# Patient Record
Sex: Female | Born: 1980 | Race: Black or African American | Hispanic: No | Marital: Single | Smoking: Never smoker
Health system: Southern US, Community
[De-identification: ages and names within clinical notes are randomized; demographics above are authoritative.]

## PROBLEM LIST (undated history)

## (undated) HISTORY — PX: TONSILLECTOMY: SUR1361

## (undated) HISTORY — PX: TUBAL LIGATION: SHX77

---

## 2004-06-03 ENCOUNTER — Observation Stay: Payer: Self-pay

## 2004-10-05 ENCOUNTER — Observation Stay: Payer: Self-pay | Admitting: Unknown Physician Specialty

## 2004-10-12 ENCOUNTER — Inpatient Hospital Stay: Payer: Self-pay | Admitting: Obstetrics & Gynecology

## 2004-10-16 ENCOUNTER — Emergency Department: Payer: Self-pay | Admitting: Emergency Medicine

## 2004-10-18 ENCOUNTER — Emergency Department: Payer: Self-pay | Admitting: Emergency Medicine

## 2006-07-19 IMAGING — US US OB US >=[ID] SNGL FETUS
1 series · 17 of 28 positions shown · non-contrast
Comparison: none

REASON FOR EXAM: Decreased fluid
COMMENTS:

[Series 1: us ob us >=(id) sngl fetus · 17 of 53 slices shown]
[im 1/53]
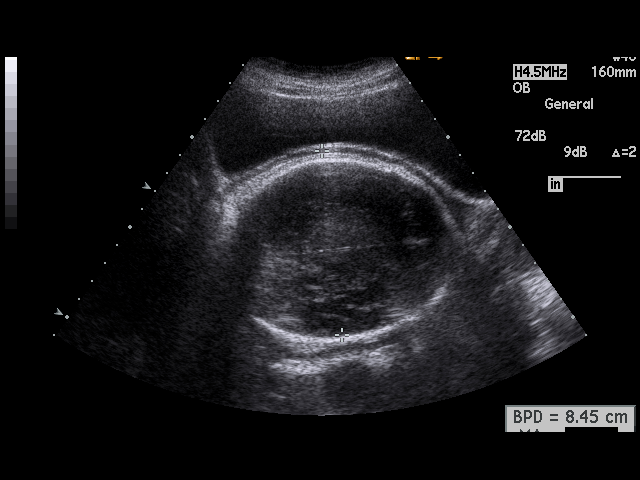
[im 4/53]
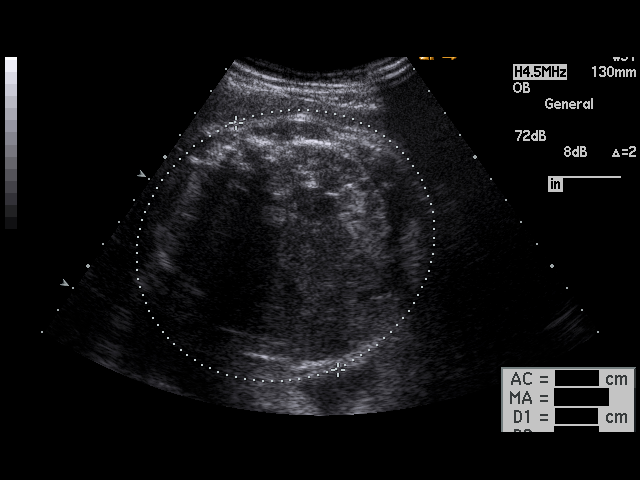
[im 8/53]
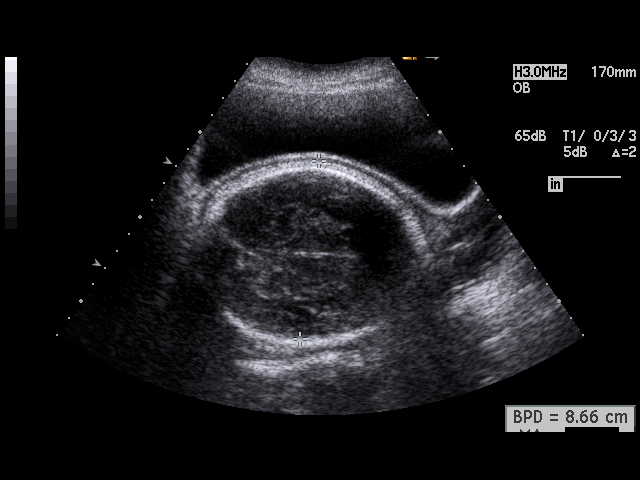
[im 10/53]
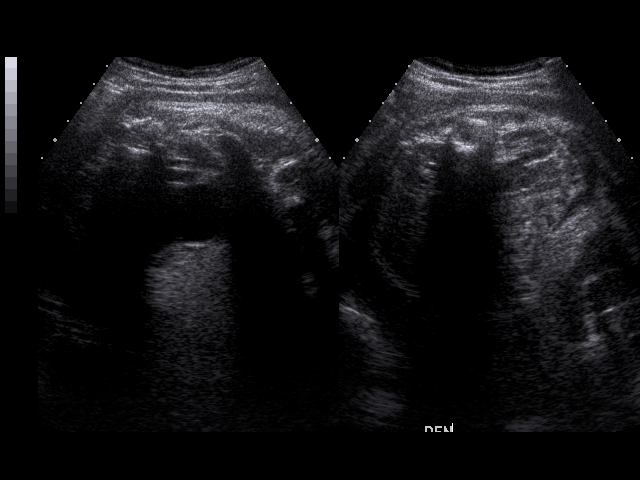
[im 14/53]
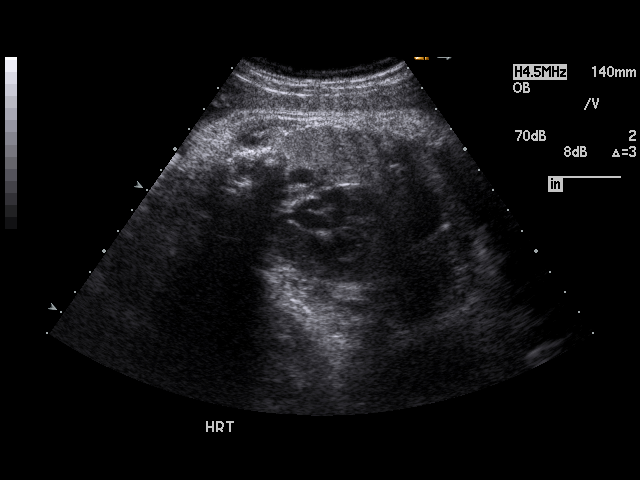
[im 18/53]
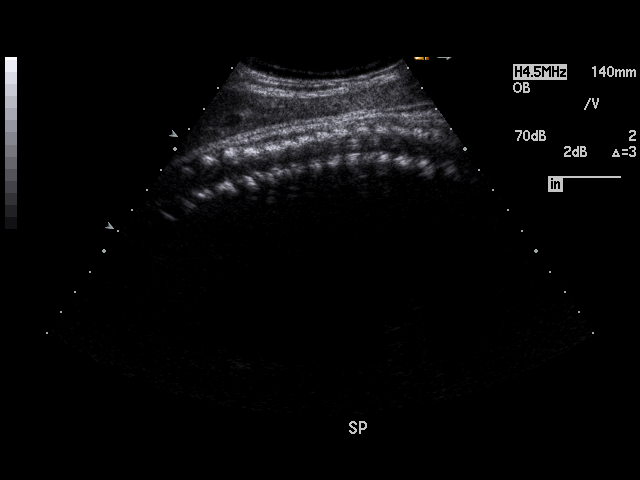
[im 20/53]
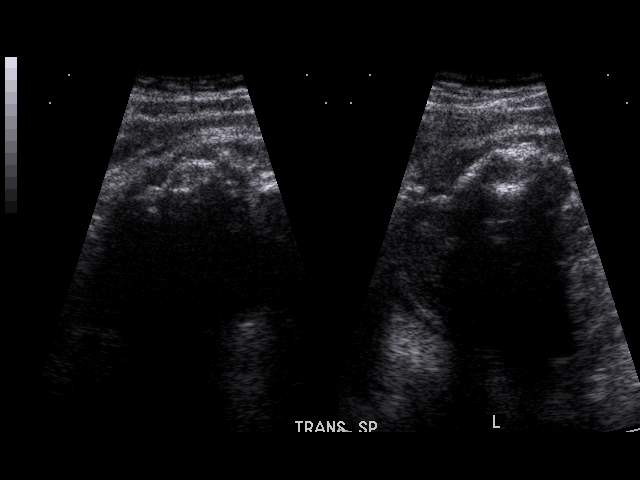
[im 24/53]
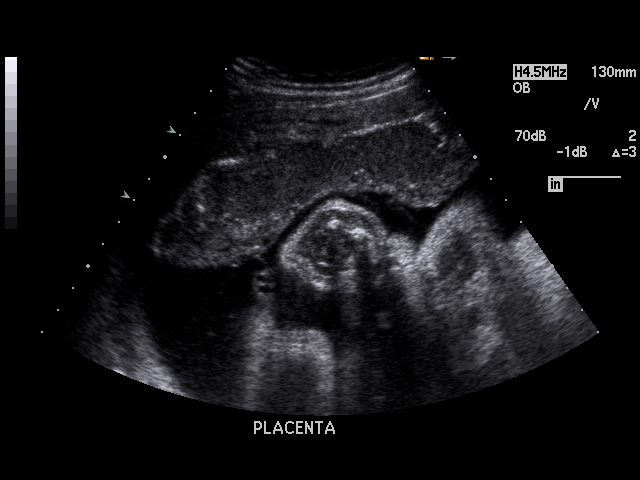
[im 27/53]
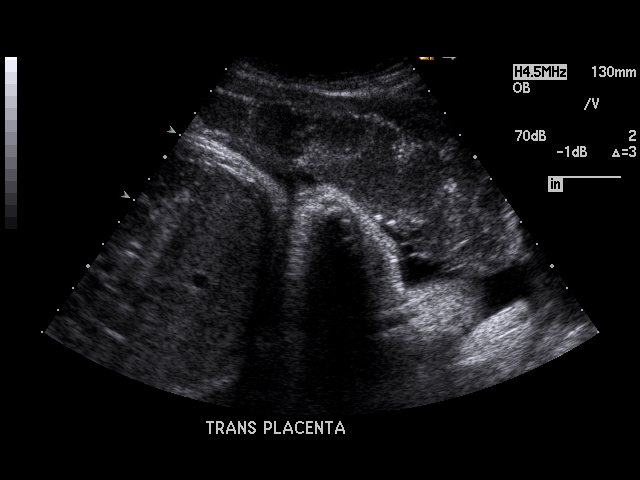
[im 29/53]
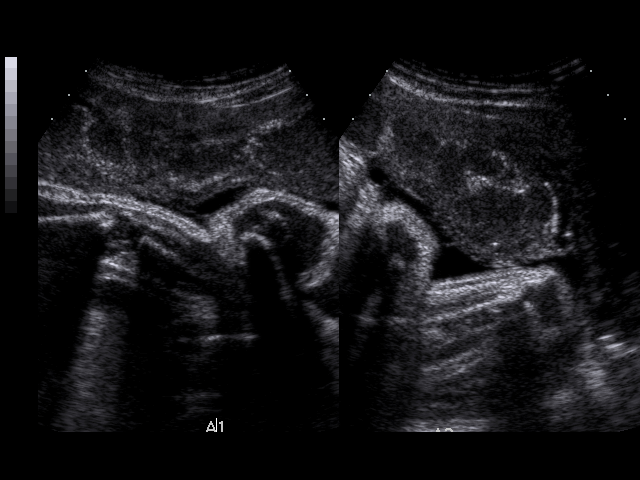
[im 33/53]
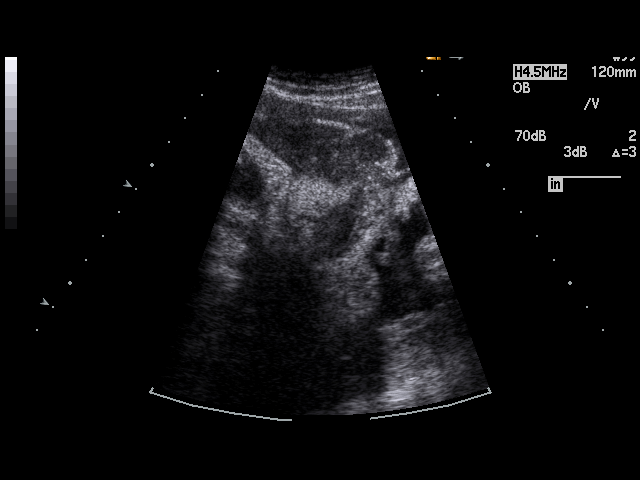
[im 35/53]
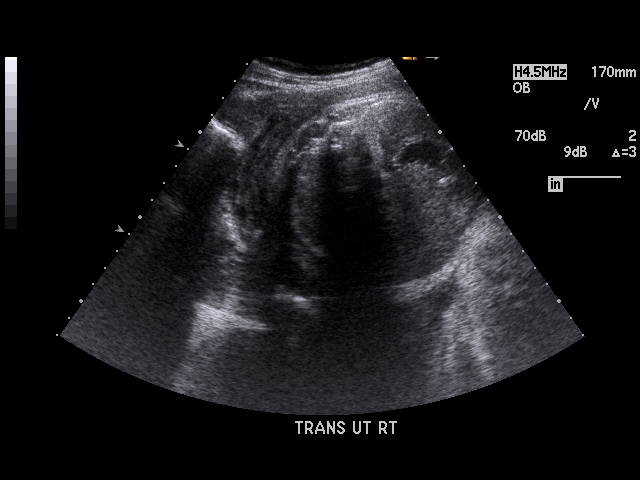
[im 39/53]
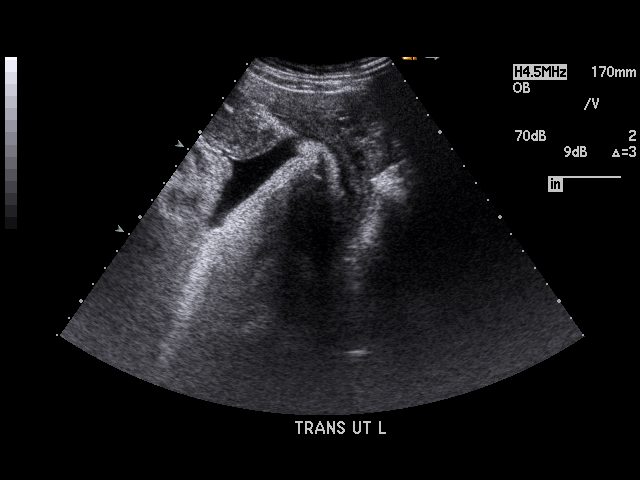
[im 43/53]
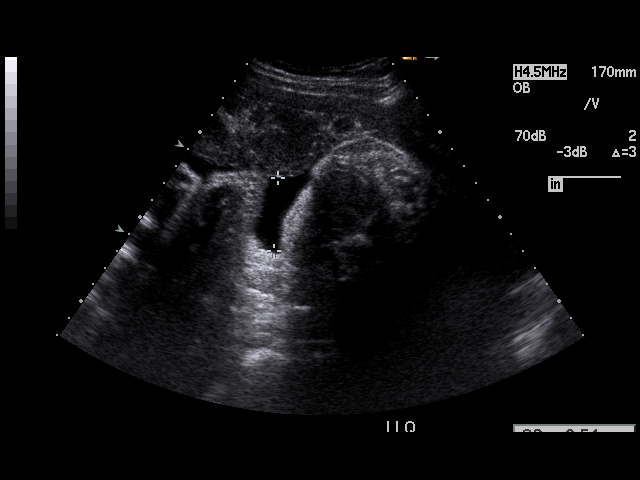
[im 45/53]
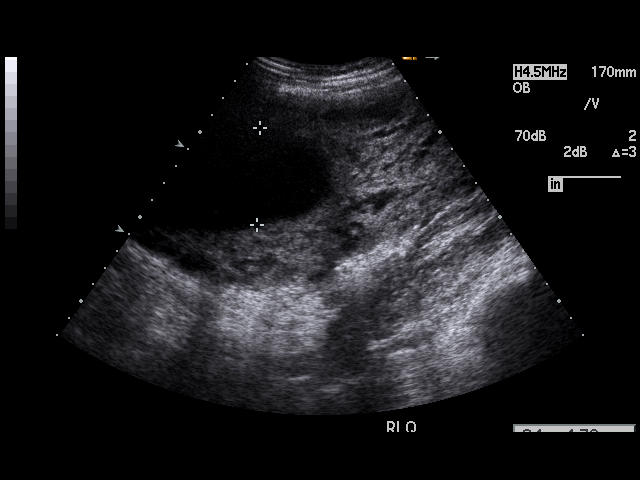
[im 49/53]
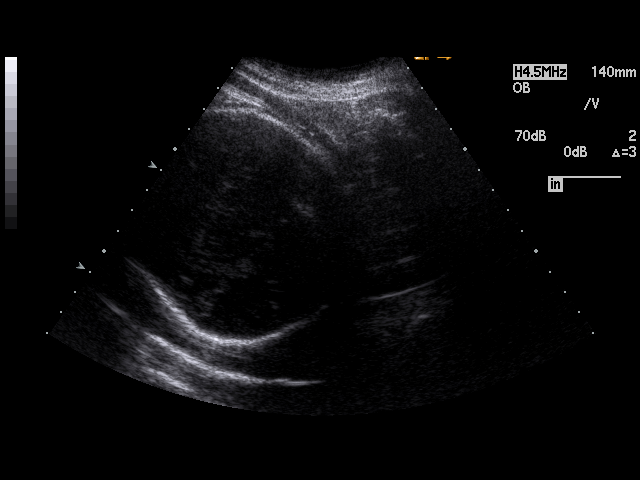
[im 53/53]
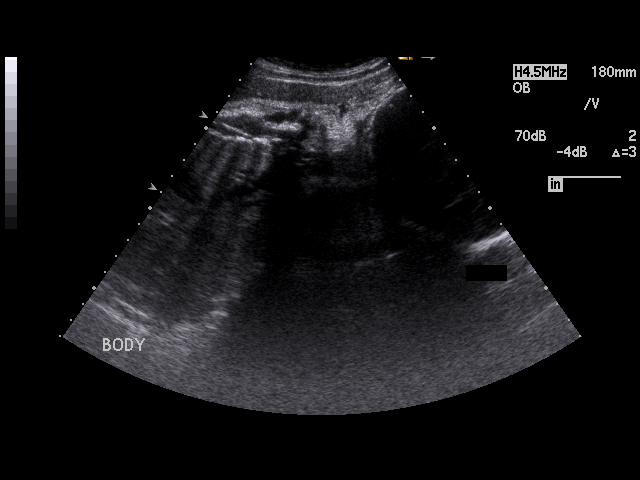

[17 of 28 positions shown; findings below may reference images not displayed]

PROCEDURE:     US  - US PREGNANCY / FETAL AGE  - October 05, 2004  [DATE]

RESULT:     Sonographic evaluation, using a routine OB protocol,
demonstrates a single intrauterine fetus with cardiac, trunk, extremity and
diaphragmatic motion seen during the study. The heart rate is 131 beats per
minute. Gestational age is calculated at 37 weeks, 0 days with an EDD of
10/26/2004. Amniotic fluid volume appears to be visually normal and measures
17.77 cm which is within the 50th to 95th percentile range. The placenta is
anterior and Grade III in appearance and extending to the fundus. There is
no evidence of marginal placenta or placenta previa.

Fetal anatomy appears to be normal.

There is a cephalic presentation. The fetal head appears to be engaged.
IMPRESSION: Gestational age of this single intrauterine fetus is 37
weeks, 0 days.

## 2011-02-11 ENCOUNTER — Emergency Department: Payer: Self-pay | Admitting: Emergency Medicine

## 2011-02-28 ENCOUNTER — Emergency Department: Payer: Self-pay | Admitting: Emergency Medicine

## 2011-04-26 ENCOUNTER — Inpatient Hospital Stay (HOSPITAL_COMMUNITY)
Admission: AD | Admit: 2011-04-26 | Discharge: 2011-04-27 | Disposition: A | Discharge status: Home / Self Care | Payer: BC Managed Care – PPO | Source: Ambulatory Visit | Attending: Family Medicine | Admitting: Family Medicine

## 2011-04-26 ENCOUNTER — Encounter (HOSPITAL_COMMUNITY): Payer: Self-pay | Admitting: *Deleted

## 2011-04-26 DIAGNOSIS — O210 Mild hyperemesis gravidarum: Secondary | ICD-10-CM | POA: Insufficient documentation

## 2011-04-26 DIAGNOSIS — O219 Vomiting of pregnancy, unspecified: Secondary | ICD-10-CM

## 2011-04-26 LAB — URINALYSIS, ROUTINE W REFLEX MICROSCOPIC
Bilirubin Urine: NEGATIVE
Glucose, UA: NEGATIVE mg/dL
Hgb urine dipstick: NEGATIVE
Protein, ur: NEGATIVE mg/dL

## 2011-04-26 LAB — POCT PREGNANCY, URINE: Preg Test, Ur: POSITIVE

## 2011-04-26 NOTE — Progress Notes (Signed)
PT SAYS SHE HAD OB APPOINTMENT TODAY- EVERYTHING OK-  THEN HOME - FIXED DINNER- THEN VOMITED AND STRINGS OF BLOOD CLOTS.   SAYS  VOMITED  ON WAY TO HOSPITAL.

## 2011-04-26 NOTE — Progress Notes (Signed)
PT DENIES NAUSEA- JUST THROAT HURTS.

## 2011-04-27 MED ORDER — GI COCKTAIL ~~LOC~~
30.0000 mL | Freq: Once | ORAL | Status: AC
  Administered 2011-04-27: 30 mL via ORAL
  Filled 2011-04-27: qty 30

## 2011-04-27 NOTE — ED Provider Notes (Signed)
Chart reviewed and agree with management and plan.  

## 2011-04-27 NOTE — ED Provider Notes (Signed)
History     No chief complaint on file.  HPI Misty Archer 30 y.o. 17w 3d  Gets prenatal care in [place].  Lives in [place].  Has had periodic vomiting during this pregnancy.  Tonight had vomiting after eating and had red blood and small clots in the vomitus.  Was concerned so came to [facility].  Has zofran at home to use for nausea.  Has not taken any today.  C/O sore throat noted after vomiting tonight.   OB History    Grav Para Term Preterm Abortions TAB SAB Ect Mult Living   2 1 1       1       Past Medical History  Diagnosis Date  . Asthma     Past Surgical History  Procedure Date  . Tonsillectomy     Family History  Problem Relation Age of Onset  . Diabetes Mother   . Hypertension Mother   . Diabetes Father   . Hypertension Father     History  Substance Use Topics  . Smoking status: Never Smoker   . Smokeless tobacco: Not on file  . Alcohol Use: No    Allergies:  Allergies  Allergen Reactions  . Codeine Rash  . Latex Rash    Prescriptions prior to admission  Medication Sig Dispense Refill  . acetaminophen (TYLENOL) 500 MG tablet Take 500 mg by mouth every 6 (six) hours as needed. For pain or headache       . flintstones complete (FLINTSTONES) 60 MG chewable tablet Chew 1 tablet by mouth daily.        . ondansetron (ZOFRAN) 4 MG tablet Take 4 mg by mouth every 8 (eight) hours as needed. For nausea         Review of Systems  HENT: Positive for sore throat.   Gastrointestinal: Positive for nausea and vomiting. Negative for constipation.   Physical Exam   Blood pressure 101/60, pulse 81, temperature 99 F (37.2 C), temperature source Oral, resp. rate 18, height 5\' 6"  (1.676 m), weight 133 lb 6 oz (60.499 kg).  Physical Exam  Nursing note and vitals reviewed. Constitutional: She is oriented to person, place, and time. She appears well-developed and well-nourished.  HENT:  Head: Normocephalic.       Throat - very mild diffuse pink color, no swelling  noted, no blood seen  Eyes: EOM are normal.       Conjunctiva red especially in right eye  Neck: Neck supple.  GI: Soft. There is no tenderness.  Musculoskeletal: Normal range of motion.  Neurological: She is alert and oriented to person, place, and time.  Skin: Skin is warm and dry.  Psychiatric: She has a normal mood and affect.    MAU Course  Procedures Results for orders placed during the hospital encounter of 04/26/11 (from the past 24 hour(s))  URINALYSIS, ROUTINE W REFLEX MICROSCOPIC     Status: Abnormal   Collection Time   04/26/11 10:05 PM      Component Value Range   Color, Urine YELLOW  YELLOW    Appearance CLEAR  CLEAR    Specific Gravity, Urine >1.030 (*) 1.005 - 1.030    pH 6.0  5.0 - 8.0    Glucose, UA NEGATIVE  NEGATIVE (mg/dL)   Hgb urine dipstick NEGATIVE  NEGATIVE    Bilirubin Urine NEGATIVE  NEGATIVE    Ketones, ur 15 (*) NEGATIVE (mg/dL)   Protein, ur NEGATIVE  NEGATIVE (mg/dL)   Urobilinogen, UA 2.0 (*) 0.0 -  1.0 (mg/dL)   Nitrite NEGATIVE  NEGATIVE    Leukocytes, UA NEGATIVE  NEGATIVE   POCT PREGNANCY, URINE     Status: Normal   Collection Time   04/26/11 10:19 PM      Component Value Range   Preg Test, Ur POSITIVE     MDM Client having sore throat from vomiting.  Will give GI cocktail.  Assessment and Plan  Vomiting in pregnancy - blood in vomitus  Plan Can use throat lozenges or warm tea for sore throat due to vomiting. Continue to use Zofran for vomiting. Drink at least 8 8-oz glasses of water every day. Call your doctor if your symptoms worsen.  Sherlyne Crownover 04/27/2011, 12:39 AM   Nolene Bernheim, NP 04/27/11 0136

## 2011-04-27 NOTE — Progress Notes (Signed)
T. Burleson, NP at bedside.  Assessment done and poc discussed with pt.  

## 2011-09-27 ENCOUNTER — Observation Stay: Payer: Self-pay | Admitting: Obstetrics and Gynecology

## 2011-09-30 ENCOUNTER — Inpatient Hospital Stay: Payer: Self-pay | Admitting: Obstetrics and Gynecology

## 2011-10-01 LAB — CBC WITH DIFFERENTIAL/PLATELET
Basophil #: 0 10*3/uL (ref 0.0–0.1)
Basophil %: 0.2 %
Eosinophil #: 0 10*3/uL (ref 0.0–0.7)
HCT: 32.7 % — ABNORMAL LOW (ref 35.0–47.0)
Lymphocyte %: 16.3 %
MCH: 32 pg (ref 26.0–34.0)
MCV: 99 fL (ref 80–100)
Neutrophil #: 4.7 10*3/uL (ref 1.4–6.5)
RDW: 13 % (ref 11.5–14.5)

## 2011-10-04 LAB — PATHOLOGY REPORT

## 2014-04-14 ENCOUNTER — Encounter (HOSPITAL_COMMUNITY): Payer: Self-pay | Admitting: *Deleted

## 2014-10-05 NOTE — Op Note (Signed)
PATIENT NAME:  Misty Archer, Misty Archer MR#:  [id number] DATE OF BIRTH:  08/02/1980  DATE OF PROCEDURE:  10/02/2011  PREOPERATIVE DIAGNOSIS: Desire for permanent sterility.   POSTOPERATIVE DIAGNOSIS: Desire for permanent sterility.   PROCEDURE: Bilateral tubal ligation.   SURGEON: Dierdre Searles. Paul Geraldyne Barraclough, MD   ANESTHESIA: General.   ESTIMATED BLOOD LOSS: Minimal.   COMPLICATIONS: None.   FINDINGS: Normal tubes and uterus.   DISPOSITION: To recovery room stable.   TECHNIQUE: The patient is prepped and draped in the usual sterile fashion after adequate anesthesia is obtained in the supine position on the operating room table. Marcaine 0.25% is used to infiltrate the skin just inferior to the umbilicus followed by skin incision using a scalpel. The rectus fascia is identified and tented anteriorly and incised using Metzenbaum scissors. Peritoneum is also penetrated and retractors are placed. The patient is then placed in Trendelenburg positioning. The left and right fallopian tubes are then grasped in the midportion and followed out to the fimbria. A loop is then tied with two Vicryl sutures, excised, and cauterized on each side. The specimen is sent to pathology for further review. Excellent hemostasis is noted. The rectus fascia is closed with a 0 Vicryl suture, the subcuticular layer is closed with a 4-0 Vicryl suture, and the skin with Dermabond. The patient goes to recovery room in stable condition. All sponge, instrument, and needle counts are correct.    ____________________________ R. Annamarie MajorPaul Aniza Shor, MD rph:drc D: 10/02/2011 09:11:15 ET T: 10/02/2011 10:59:46 ET JOB#: [id number] Harrel LemonOBERT P Auburn Hert MD ELECTRONICALLY SIGNED 10/14/2011 10:25

## 2014-10-12 ENCOUNTER — Encounter (HOSPITAL_COMMUNITY): Payer: Self-pay | Admitting: Emergency Medicine

## 2014-10-12 DIAGNOSIS — H9209 Otalgia, unspecified ear: Secondary | ICD-10-CM | POA: Diagnosis not present

## 2014-10-12 DIAGNOSIS — R05 Cough: Secondary | ICD-10-CM | POA: Diagnosis present

## 2014-10-12 DIAGNOSIS — Z91040 Latex allergy status: Secondary | ICD-10-CM | POA: Diagnosis not present

## 2014-10-12 DIAGNOSIS — Z79899 Other long term (current) drug therapy: Secondary | ICD-10-CM | POA: Diagnosis not present

## 2014-10-12 DIAGNOSIS — J45909 Unspecified asthma, uncomplicated: Secondary | ICD-10-CM | POA: Diagnosis not present

## 2014-10-12 NOTE — ED Notes (Addendum)
Reports asthma flare-up 1 week ago followed by "small" asthma attacks over the past week.  Reports non-productive cough x 1 week.  Since Friday she is also having runny nose, low grade fever, and body aches.  States her daughter has been sick with a cold.

## 2014-10-13 ENCOUNTER — Emergency Department (HOSPITAL_COMMUNITY)
Admission: EM | Admit: 2014-10-13 | Discharge: 2014-10-13 | Disposition: A | Discharge status: Home / Self Care | Payer: BLUE CROSS/BLUE SHIELD | Attending: Emergency Medicine | Admitting: Emergency Medicine

## 2014-10-13 ENCOUNTER — Emergency Department (HOSPITAL_COMMUNITY): Payer: BLUE CROSS/BLUE SHIELD

## 2014-10-13 ENCOUNTER — Encounter (HOSPITAL_COMMUNITY): Payer: Self-pay | Admitting: Emergency Medicine

## 2014-10-13 DIAGNOSIS — J9801 Acute bronchospasm: Secondary | ICD-10-CM

## 2014-10-13 DIAGNOSIS — J3489 Other specified disorders of nose and nasal sinuses: Secondary | ICD-10-CM

## 2014-10-13 DIAGNOSIS — R0981 Nasal congestion: Secondary | ICD-10-CM

## 2014-10-13 MED ORDER — LORATADINE 10 MG PO TABS: 10.0000 mg | ORAL_TABLET | Freq: Every day | ORAL | Status: DC

## 2014-10-13 MED ORDER — PREDNISONE 20 MG PO TABS
60.0000 mg | ORAL_TABLET | Freq: Once | ORAL | Status: AC
  Administered 2014-10-13: 60 mg via ORAL
  Filled 2014-10-13: qty 3

## 2014-10-13 MED ORDER — LORATADINE 10 MG PO TABS
10.0000 mg | ORAL_TABLET | Freq: Once | ORAL | Status: AC
  Administered 2014-10-13: 10 mg via ORAL
  Filled 2014-10-13: qty 1

## 2014-10-13 MED ORDER — ALBUTEROL SULFATE (2.5 MG/3ML) 0.083% IN NEBU
5.0000 mg | INHALATION_SOLUTION | Freq: Once | RESPIRATORY_TRACT | Status: AC
  Administered 2014-10-13: 5 mg via RESPIRATORY_TRACT
  Filled 2014-10-13: qty 6

## 2014-10-13 MED ORDER — MOMETASONE FUROATE 50 MCG/ACT NA SUSP: 2.0000 | Freq: Every day | NASAL | Status: DC

## 2014-10-13 MED ORDER — OXYMETAZOLINE HCL 0.05 % NA SOLN
1.0000 | Freq: Two times a day (BID) | NASAL | Status: DC
Start: 2014-10-13 — End: 2015-08-17

## 2014-10-13 MED ORDER — PREDNISONE 20 MG PO TABS: ORAL_TABLET | ORAL | Status: DC

## 2014-10-13 NOTE — ED Provider Notes (Signed)
CSN: [id number]     Arrival date & time 10/12/14  2351 History  This chart was scribed for Loren Raceravid Noorah Giammona, MD by Abel PrestoKara Demonbreun, ED Scribe. This patient was seen in room A13C/A13C and the patient's care was started at 1:13 AM.     Chief Complaint  Patient presents with  . Cough  . Nasal Congestion     Patient is a 34 y.o. female presenting with cough. The history is provided by the patient. No language interpreter was used.  Cough Associated symptoms: chills, ear pain, fever, myalgias and rhinorrhea   Associated symptoms: no chest pain, no shortness of breath and no sore throat    HPI Comments: Misty Archer is a 34 y.o. female with PMHx of asthma who presents to the Emergency Department complaining of cough and nasal congestion with onset 1 week ago. 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 Pt states daughter has been sick recently with similar symptoms.   Pt also reports frequent "small" asthma attacks throughout the week.  She had been using her inhaler with mild relief. Pt notes associated low grade fever, chills, rhinorrhea, sneezing, sinus pressure, chest tightness, generalized body aches, ear pain, eye pain with onset 3 days ago.  Pt denies swelling or pain in bilateral LEs and chest pain.   Past Medical History  Diagnosis Date  . Asthma    Past Surgical History  Procedure Laterality Date  . Tonsillectomy     Family History  Problem Relation Age of Onset  . Diabetes Mother   . Hypertension Mother   . Diabetes Father   . Hypertension Father    History  Substance Use Topics  . Smoking status: Never Smoker   . Smokeless tobacco: Not on file  . Alcohol Use: No   OB History    Gravida Para Term Preterm AB TAB SAB Ectopic Multiple Living   2 1 1       1      Review of Systems  Constitutional: Positive for fever and chills.  HENT: Positive for congestion, ear pain, rhinorrhea, sinus pressure and sneezing. Negative for sore throat.   Eyes:  Positive for pain.  Respiratory: Positive for cough and chest tightness. Negative for shortness of breath.   Cardiovascular: Negative for chest pain and leg swelling.  Musculoskeletal: Positive for myalgias.     Allergies  Codeine and Latex  Home Medications   Prior to Admission medications   Medication Sig Start Date End Date Taking? Authorizing Provider  acetaminophen (TYLENOL) 500 MG tablet Take 500 mg by mouth every 6 (six) hours as needed. For pain or headache    Yes Historical Provider, MD  albuterol (PROVENTIL HFA;VENTOLIN HFA) 108 (90 BASE) MCG/ACT inhaler Inhale 2 puffs into the lungs every 6 (six) hours as needed for wheezing or shortness of breath.   Yes Historical Provider, MD  naproxen sodium (ANAPROX) 220 MG tablet Take 440 mg by mouth daily as needed (pain).   Yes Historical Provider, MD  OVER THE COUNTER MEDICATION Take 1 tablet by mouth daily as needed (cold / congestion). "Cold & Allergy"   Yes Historical Provider, MD  loratadine (CLARITIN) 10 MG tablet Take 1 tablet (10 mg total) by mouth daily. One po daily x 5 days 10/13/14   Loren Raceravid Ashly Yepez, MD  mometasone (NASONEX) 50 MCG/ACT nasal spray Place 2 sprays into the nose daily. 10/13/14   Loren Raceravid Amado Andal, MD  oxymetazoline (AFRIN NASAL SPRAY) 0.05 % nasal spray Place 1 spray  into both nostrils 2 (two) times daily. 10/13/14   Loren Racer, MD  predniSONE (DELTASONE) 20 MG tablet 3 tabs po day one, then 2 tabs daily x 4 days 10/13/14   Loren Racer, MD   BP 119/72 mmHg  Pulse 83  Temp(Src) 99.1 F (37.3 C) (Oral)  Resp 22  Wt 144 lb 6.4 oz (65.499 kg)  SpO2 97%  LMP 10/12/2014  Breastfeeding? Unknown Physical Exam  Constitutional: She is oriented to person, place, and time. She appears well-developed and well-nourished.  HENT:  Head: Normocephalic.  Eyes: Conjunctivae are normal.  Neck: Normal range of motion. Neck supple.  Pulmonary/Chest: Effort normal.  Musculoskeletal: Normal range of motion.  Neurological:  She is alert and oriented to person, place, and time.  Skin: Skin is warm and dry.  Psychiatric: She has a normal mood and affect. Her behavior is normal.  Nursing note and vitals reviewed.   ED Course  Procedures (including critical care time) DIAGNOSTIC STUDIES: Oxygen Saturation is 97% on room air, normal by my interpretation.    COORDINATION OF CARE: 1:20 AM Discussed treatment plan with patient at beside, the patient agrees with the plan and has no further questions at this time.   Labs Review Labs Reviewed - No data to display  Imaging Review Dg Chest 2 View  10/13/2014   CLINICAL DATA:  Cough and congestion with low-grade fever, 1 week duration.  EXAM: CHEST  2 VIEW  COMPARISON:  None.  FINDINGS: The heart size and mediastinal contours are within normal limits. Both lungs are clear. The visualized skeletal structures are unremarkable.  IMPRESSION: No active cardiopulmonary disease.   Electronically Signed   By: Ellery Plunk M.D.   On: 10/13/2014 00:17     EKG Interpretation None      MDM   Final diagnoses:  Bronchospasm  Nasal congestion  Sinus pressure   I personally performed the services described in this documentation, which was scribed in my presence. The recorded information has been reviewed and is accurate.  Feels better after breathing treatment. Return precautions given.    Loren Racer, MD 10/14/14 [phone]

## 2014-10-13 NOTE — ED Notes (Signed)
Pt. Left with all belongings and refused wheelchair 

## 2014-10-13 NOTE — Discharge Instructions (Signed)
Bronchospasm A bronchospasm is a spasm or tightening of the airways going into the lungs. During a bronchospasm breathing becomes more difficult because the airways get smaller. When this happens there can be coughing, a whistling sound when breathing (wheezing), and difficulty breathing. Bronchospasm is often associated with asthma, but not all patients who experience a bronchospasm have asthma. CAUSES  A bronchospasm is caused by inflammation or irritation of the airways. The inflammation or irritation may be triggered by:   Allergies (such as to animals, pollen, food, or mold). Allergens that cause bronchospasm may cause wheezing immediately after exposure or many hours later.   Infection. Viral infections are believed to be the most common cause of bronchospasm.   Exercise.   Irritants (such as pollution, cigarette smoke, strong odors, aerosol sprays, and paint fumes).   Weather changes. Winds increase molds and pollens in the air. Rain refreshes the air by washing irritants out. Cold air may cause inflammation.   Stress and emotional upset.  SIGNS AND SYMPTOMS   Wheezing.   Excessive nighttime coughing.   Frequent or severe coughing with a simple cold.   Chest tightness.   Shortness of breath.  DIAGNOSIS  Bronchospasm is usually diagnosed through a history and physical exam. Tests, such as chest X-rays, are sometimes done to look for other conditions. TREATMENT   Inhaled medicines can be given to open up your airways and help you breathe. The medicines can be given using either an inhaler or a nebulizer machine.  Corticosteroid medicines may be given for severe bronchospasm, usually when it is associated with asthma. HOME CARE INSTRUCTIONS   Always have a plan prepared for seeking medical care. Know when to call your health care provider and local emergency services (911 in the [place] Know where you can access local emergency care.  Only take medicines as  directed by your health care provider.  If you were prescribed an inhaler or nebulizer machine, ask your health care provider to explain how to use it correctly. Always use a spacer with your inhaler if you were given one.  It is necessary to remain calm during an attack. Try to relax and breathe more slowly.  Control your home environment in the following ways:   Change your heating and air conditioning filter at least once a month.   Limit your use of fireplaces and wood stoves.  Do not smoke and do not allow smoking in your home.   Avoid exposure to perfumes and fragrances.   Get rid of pests (such as roaches and mice) and their droppings.   Throw away plants if you see mold on them.   Keep your house clean and dust free.   Replace carpet with wood, tile, or vinyl flooring. Carpet can trap dander and dust.   Use allergy-proof pillows, mattress covers, and box spring covers.   Wash bed sheets and blankets every week in hot water and dry them in a dryer.   Use blankets that are made of polyester or cotton.   Wash hands frequently. SEEK MEDICAL CARE IF:   You have muscle aches.   You have chest pain.   The sputum changes from clear or white to yellow, green, gray, or bloody.   The sputum you cough up gets thicker.   There are problems that may be related to the medicine you are given, such as a rash, itching, swelling, or trouble breathing.  SEEK IMMEDIATE MEDICAL CARE IF:   You have worsening wheezing and coughing even  after taking your prescribed medicines.   You have increased difficulty breathing.   You develop severe chest pain. MAKE SURE YOU:   Understand these instructions.  Will watch your condition.  Will get help right away if you are not doing well or get worse. Document Released: 06/02/2003 Document Revised: 06/04/2013 Document Reviewed: 11/19/2012 [employer] Patient Information 2015 [employer]. This information is not  intended to replace advice given to you by your health care provider. Make sure you discuss any questions you have with your health care provider.  Sinusitis Sinusitis is redness, soreness, and inflammation of the paranasal sinuses. Paranasal sinuses are air pockets within the bones of your face (beneath the eyes, the middle of the forehead, or above the eyes). In healthy paranasal sinuses, mucus is able to drain out, and air is able to circulate through them by way of your nose. However, when your paranasal sinuses are inflamed, mucus and air can become trapped. This can allow bacteria and other germs to grow and cause infection. Sinusitis can develop quickly and last only a short time (acute) or continue over a long period (chronic). Sinusitis that lasts for more than 12 weeks is considered chronic.  CAUSES  Causes of sinusitis include:  Allergies.  Structural abnormalities, such as displacement of the cartilage that separates your nostrils (deviated septum), which can decrease the air flow through your nose and sinuses and affect sinus drainage.  Functional abnormalities, such as when the small hairs (cilia) that line your sinuses and help remove mucus do not work properly or are not present. SIGNS AND SYMPTOMS  Symptoms of acute and chronic sinusitis are the same. The primary symptoms are pain and pressure around the affected sinuses. Other symptoms include:  Upper toothache.  Earache.  Headache.  Bad breath.  Decreased sense of smell and taste.  A cough, which worsens when you are lying flat.  Fatigue.  Fever.  Thick drainage from your nose, which often is green and may contain pus (purulent).  Swelling and warmth over the affected sinuses. DIAGNOSIS  Your health care provider will perform a physical exam. During the exam, your health care provider may:  Look in your nose for signs of abnormal growths in your nostrils (nasal polyps).  Tap over the affected sinus to check  for signs of infection.  View the inside of your sinuses (endoscopy) using an imaging device that has a light attached (endoscope). If your health care provider suspects that you have chronic sinusitis, one or more of the following tests may be recommended:  Allergy tests.  Nasal culture. A sample of mucus is taken from your nose, sent to a lab, and screened for bacteria.  Nasal cytology. A sample of mucus is taken from your nose and examined by your health care provider to determine if your sinusitis is related to an allergy. TREATMENT  Most cases of acute sinusitis are related to a viral infection and will resolve on their own within 10 days. Sometimes medicines are prescribed to help relieve symptoms (pain medicine, decongestants, nasal steroid sprays, or saline sprays).  However, for sinusitis related to a bacterial infection, your health care provider will prescribe antibiotic medicines. These are medicines that will help kill the bacteria causing the infection.  Rarely, sinusitis is caused by a fungal infection. In theses cases, your health care provider will prescribe antifungal medicine. For some cases of chronic sinusitis, surgery is needed. Generally, these are cases in which sinusitis recurs more than 3 times per year, despite   other treatments. HOME CARE INSTRUCTIONS   Drink plenty of water. Water helps thin the mucus so your sinuses can drain more easily.  Use a humidifier.  Inhale steam 3 to 4 times a day (for example, sit in the bathroom with the shower running).  Apply a warm, moist washcloth to your face 3 to 4 times a day, or as directed by your health care provider.  Use saline nasal sprays to help moisten and clean your sinuses.  Take medicines only as directed by your health care provider.  If you were prescribed either an antibiotic or antifungal medicine, finish it all even if you start to feel better. SEEK IMMEDIATE MEDICAL CARE IF:  You have increasing pain or  severe headaches.  You have nausea, vomiting, or drowsiness.  You have swelling around your face.  You have vision problems.  You have a stiff neck.  You have difficulty breathing. MAKE SURE YOU:   Understand these instructions.  Will watch your condition.  Will get help right away if you are not doing well or get worse. Document Released: 05/30/2005 Document Revised: 10/14/2013 Document Reviewed: 06/14/2011 [employer] Patient Information 2015 [employer], [place]. This information is not intended to replace advice given to you by your health care provider. Make sure you discuss any questions you have with your health care provider.

## 2014-10-21 NOTE — H&P (Signed)
L&D Evaluation:  History Expanded:   HPI 34 yo G2P1001 whose EDC = 4/21.  [facility].  Pt presents with contractions.    Gravida 2    Term 1    Living 1    Blood Type O positive    Group B Strep Results (Result >5wks must be treated as unknown) negative    Maternal HIV Negative    Maternal Syphilis Ab Nonreactive    Maternal Varicella Immune    Rubella Results immune    Presents with contractions    Patient's Medical History No Chronic Illness    Patient's Surgical History none    Medications Pre Natal Vitamins    Allergies Codeine   Exam:   General no apparent distress    Mental Status clear    Chest clear    Heart normal sinus rhythm    Abdomen gravid, non-tender    Estimated Fetal Weight Average for gestational age    Pelvic 3-4    Mebranes Intact    FHT normal rate with no decels    Ucx irregular   Impression:   Impression early labor   Plan:   Comments Observation   Electronic Signatures: Towana Badgerosenow, Philip J (MD)  (Signed 20-Apr-13 01:36)  Authored: L&D Evaluation   Last Updated: 20-Apr-13 01:36 by Towana Badgerosenow, Philip J (MD)

## 2014-10-21 NOTE — H&P (Signed)
L&D Evaluation:  History:   HPI 34 yo G2P1001 [facility] 2373w2d [facility] presenting with sharp cramping pain starting this evening.  Was checked in clinic yesterday and had her membranes stripped.  Was noted to be 3/70/-1 at that check.  +FM, no LOF, no VB.  [facility].  PNL O+ / ABSC neg / RPR NR / HIV neg / HBsAg neg / RI / VI / pap wnl / 1-hr glucola 89 / GBS negative 09/12/2011    Patient's Medical History Asthma    Patient's Surgical History Tonsilectomy    Medications Pre Natal Vitamins    Allergies Codeine, Latex    Social History none    Family History Non-Contributory   ROS:   ROS All systems were reviewed.  HEENT, CNS, GI, GU, Respiratory, CV, Renal and Musculoskeletal systems were found to be normal.   Exam:   Vital Signs stable    General no apparent distress    Abdomen gravid, non-tender    Estimated Fetal Weight Average for gestational age    Fetal Position vtx    Edema no edema    Pelvic no external lesions, 3/70/-1 unchanged from clinic yesterday    Mebranes Intact    Ucx irregular   Impression:   Impression Braxton Hicks contractions vs round ligament pain   Plan:   Comments - The patient only showed some irregular contractions on monitoring, given her advanced cervical dilation she was recheked 2-hrs after presentation with no change.  A lot of her symptoms are positional and sound typical of round ligament pain.  The patient has scheduled follow up in clinic 10/03/2011   Electronic Signatures: Lorrene ReidStaebler, Ellean Firman M (MD)  (Signed 16-Apr-13 21:46)  Authored: L&D Evaluation   Last Updated: 16-Apr-13 21:46 by Lorrene ReidStaebler, Adal Sereno M (MD)

## 2015-04-02 ENCOUNTER — Ambulatory Visit (INDEPENDENT_AMBULATORY_CARE_PROVIDER_SITE_OTHER): Payer: BC Managed Care – PPO | Admitting: Family Medicine

## 2015-04-02 ENCOUNTER — Encounter: Payer: Self-pay | Admitting: Family Medicine

## 2015-04-02 VITALS — BP 118/68 | HR 72 | Ht 67.0 in | Wt 145.0 lb

## 2015-04-02 DIAGNOSIS — G44219 Episodic tension-type headache, not intractable: Secondary | ICD-10-CM | POA: Diagnosis not present

## 2015-04-02 DIAGNOSIS — R55 Syncope and collapse: Secondary | ICD-10-CM

## 2015-04-02 NOTE — Progress Notes (Addendum)
Name: Misty Archer   MRN: [id number]    DOB: 08/04/1953   Date:04/02/2015       Progress Note  Subjective  Chief Complaint  Chief Complaint  Patient presents with  . Loss of Consciousness    had a "migraine headache today" was standing in sun and started feeling worse. Went inside school and "slid down the wall after everything went dark". Only "out a few seconds"    Loss of Consciousness This is a new problem. The current episode started today. There was no loss of consciousness. The symptoms are aggravated by standing. Associated symptoms include headaches and nausea. Pertinent negatives include no abdominal pain, back pain, bladder incontinence, bowel incontinence, chest pain, dizziness, fever, focal weakness, malaise/fatigue, palpitations, vertigo or weakness. She has tried position change for the symptoms. Her past medical history is significant for vertigo. There is no history of arrhythmia, CAD, a clotting disorder, CVA, DM, HTN, seizures, a sudden death in family or TIA.  Headache  This is a new problem. The current episode started 1 to 4 weeks ago. The problem occurs daily. The problem has been waxing and waning. The pain is located in the frontal region. The pain quality is not similar to prior headaches. The quality of the pain is described as band-like. The pain is at a severity of 3/10. The pain is moderate. Associated symptoms include nausea, phonophobia and photophobia. Pertinent negatives include no abdominal pain, back pain, blurred vision, coughing, dizziness, ear pain, eye watering, fever, insomnia, neck pain, numbness, sore throat, tingling, weakness or weight loss. The symptoms are aggravated by noise. She has tried acetaminophen for the symptoms. The treatment provided mild relief. There is no history of hypertension or recent head traumas.    No problem-specific assessment & plan notes found for this encounter.   No past medical history on file.  Past Surgical History   Procedure Laterality Date  . Tubal ligation      No family history on file.  Social History   Social History  . Marital Status: Unknown    Spouse Name: N/A  . Number of Children: N/A  . Years of Education: N/A   Occupational History  . Not on file.   Social History Main Topics  . Smoking status: Never Smoker   . Smokeless tobacco: Not on file  . Alcohol Use: No  . Drug Use: No  . Sexual Activity: Not on file   Other Topics Concern  . Not on file   Social History Narrative  . No narrative on file    No Known Allergies   Review of Systems  Constitutional: Negative for fever, chills, weight loss and malaise/fatigue.  HENT: Negative for ear discharge, ear pain and sore throat.   Eyes: Positive for photophobia. Negative for blurred vision.  Respiratory: Negative for cough, sputum production, shortness of breath and wheezing.   Cardiovascular: Positive for syncope. Negative for chest pain, palpitations and leg swelling.  Gastrointestinal: Positive for nausea. Negative for heartburn, abdominal pain, diarrhea, constipation, blood in stool, melena and bowel incontinence.  Genitourinary: Negative for bladder incontinence, dysuria, urgency, frequency and hematuria.  Musculoskeletal: Negative for myalgias, back pain, joint pain and neck pain.  Skin: Negative for rash.  Neurological: Positive for headaches. Negative for dizziness, vertigo, tingling, sensory change, focal weakness, weakness and numbness.  Endo/Heme/Allergies: Negative for environmental allergies and polydipsia. Does not bruise/bleed easily.  Psychiatric/Behavioral: Negative for depression and suicidal ideas. The patient is not nervous/anxious and does not have insomnia.  Objective  Filed Vitals:   04/02/15 1524  BP: 118/68  Pulse: 72  Height:  (1.702 m)  Weight: 145 lb (65.772 kg)    Physical Exam  Constitutional: She is oriented to person, place, and time and well-developed, well-nourished,  and in no distress. No distress.  HENT:  Head: Normocephalic and atraumatic.  Right Ear: External ear normal.  Left Ear: External ear normal.  Nose: Nose normal.  Mouth/Throat: Oropharynx is clear and moist.  Eyes: Conjunctivae and EOM are normal. Pupils are equal, round, and reactive to light. Right eye exhibits no discharge. Left eye exhibits no discharge.  Neck: Normal range of motion. Neck supple. No JVD present. No thyromegaly present.  Cardiovascular: Normal rate, regular rhythm, normal heart sounds and intact distal pulses.  Exam reveals no gallop and no friction rub.   No murmur heard. Pulmonary/Chest: Effort normal and breath sounds normal.  Abdominal: Soft. Bowel sounds are normal. She exhibits no mass. There is no tenderness. There is no guarding.  Musculoskeletal: Normal range of motion. She exhibits no edema.  Lymphadenopathy:    She has no cervical adenopathy.  Neurological: She is alert and oriented to person, place, and time. She has normal motor skills, normal sensation, normal strength, normal reflexes and intact cranial nerves.  Skin: Skin is warm and dry. She is not diaphoretic.  Psychiatric: Mood and affect normal.  Nursing note and vitals reviewed.     Assessment & Plan  Problem List Items Addressed This Visit    None    Visit Diagnoses    Episodic tension-type headache, not intractable    -  Primary    Relevant Orders    CT Head W Contrast    CT Head Wo Contrast    Vasovagal near syncope             Dr. Elizabeth Sauer [facility]  04/02/2015   CT was denied by insurance- called pt to recheck her in office in 1 week- was notified of CT and if headache gets worse or has another syncopal episode... is to go directly to ER

## 2015-04-03 ENCOUNTER — Encounter (HOSPITAL_COMMUNITY): Payer: Self-pay | Admitting: Emergency Medicine

## 2015-04-05 ENCOUNTER — Emergency Department (HOSPITAL_COMMUNITY): Payer: BLUE CROSS/BLUE SHIELD

## 2015-04-05 ENCOUNTER — Encounter (HOSPITAL_COMMUNITY): Payer: Self-pay | Admitting: *Deleted

## 2015-04-05 ENCOUNTER — Emergency Department (HOSPITAL_COMMUNITY)
Admission: EM | Admit: 2015-04-05 | Discharge: 2015-04-06 | Disposition: A | Discharge status: Home / Self Care | Payer: BLUE CROSS/BLUE SHIELD | Attending: Emergency Medicine | Admitting: Emergency Medicine

## 2015-04-05 ENCOUNTER — Other Ambulatory Visit: Payer: Self-pay

## 2015-04-05 DIAGNOSIS — R079 Chest pain, unspecified: Secondary | ICD-10-CM | POA: Diagnosis present

## 2015-04-05 DIAGNOSIS — Z91040 Latex allergy status: Secondary | ICD-10-CM | POA: Insufficient documentation

## 2015-04-05 DIAGNOSIS — G43809 Other migraine, not intractable, without status migrainosus: Secondary | ICD-10-CM | POA: Diagnosis not present

## 2015-04-05 DIAGNOSIS — Z3202 Encounter for pregnancy test, result negative: Secondary | ICD-10-CM | POA: Insufficient documentation

## 2015-04-05 DIAGNOSIS — Z79899 Other long term (current) drug therapy: Secondary | ICD-10-CM | POA: Diagnosis not present

## 2015-04-05 DIAGNOSIS — J45909 Unspecified asthma, uncomplicated: Secondary | ICD-10-CM | POA: Insufficient documentation

## 2015-04-05 MED ORDER — KETOROLAC TROMETHAMINE 30 MG/ML IJ SOLN
30.0000 mg | Freq: Once | INTRAMUSCULAR | Status: AC
  Administered 2015-04-05: 30 mg via INTRAVENOUS
  Filled 2015-04-05: qty 1

## 2015-04-05 MED ORDER — PROCHLORPERAZINE EDISYLATE 5 MG/ML IJ SOLN
10.0000 mg | Freq: Once | INTRAMUSCULAR | Status: AC
  Administered 2015-04-05: 10 mg via INTRAVENOUS
  Filled 2015-04-05: qty 2

## 2015-04-05 NOTE — ED Notes (Signed)
Pt arrives via EMS from home. Pt c/o migraine since Thursday. Pt called PCP who tried to get her in for a CT but was unable. Told pt to take tylenol for pain, no relief. Pt c/o nausea and light sensitivity as well. Started having CP tonight to the right chest and radiates to her back.

## 2015-04-05 NOTE — ED Provider Notes (Signed)
CSN: [id number]     Arrival date & time 04/05/15  2224 History   First MD Initiated Contact with Patient 04/05/15 2228     Chief Complaint  Patient presents with  . Migraine  . Chest Pain    HPI The patient presents to the emergency room with complaints of a headache since Thursday. Patient states symptoms started earlier in the week. She first noticed it while she was at work. 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 She started developing a headache. Throughout the week off and on she's had the headache come and go. She is noticed that she is light sensitive and sound sensitive. She has not had any difficulty with her speech. No numbness or weakness of her extremities. No neck pain or stiffness. No fevers or chills. She went to see her primary doctor who recommended taking Tylenol. This evening she had an episode where she had some sharp stabbing pain in the right side of her chest. She decided to come to the emergency room for further evaluation. She denies any trouble with shortness of breath. No leg swelling. No history of heart disease. No history of pulmonary was in. Past Medical History  Diagnosis Date  . Asthma    Past Surgical History  Procedure Laterality Date  . Tonsillectomy    . Tubal ligation     Family History  Problem Relation Age of Onset  . Diabetes Mother   . Hypertension Mother   . Diabetes Father   . Hypertension Father    Social History  Substance Use Topics  . Smoking status: Never Smoker   . Smokeless tobacco: None  . Alcohol Use: No   OB History    Gravida Para Term Preterm AB TAB SAB Ectopic Multiple Living   0 0 0 0 0       Review of Systems  All other systems reviewed and are negative.     Allergies  Codeine and Latex  Home Medications   Prior to Admission medications   Medication Sig Start Date End Date Taking? Authorizing Provider  acetaminophen (TYLENOL) 500 MG tablet Take 500 mg by mouth every 6 (six)  hours as needed. For pain or headache    Yes Historical Provider, MD  albuterol (PROVENTIL HFA;VENTOLIN HFA) 108 (90 BASE) MCG/ACT inhaler Inhale 2 puffs into the lungs every 6 (six) hours as needed for wheezing or shortness of breath.    Historical Provider, MD  loratadine (CLARITIN) 10 MG tablet Take 1 tablet (10 mg total) by mouth daily. One po daily x 5 days Patient not taking: Reported on 04/05/2015 10/13/14   Loren Racer, MD  mometasone (NASONEX) 50 MCG/ACT nasal spray Place 2 sprays into the nose daily. Patient not taking: Reported on 04/05/2015 10/13/14   Loren Racer, MD  oxymetazoline [facility] NASAL SPRAY) 0.05 % nasal spray Place 1 spray into both nostrils 2 (two) times daily. Patient not taking: Reported on 04/05/2015 10/13/14   Loren Racer, MD  predniSONE (DELTASONE) 20 MG tablet 3 tabs po day one, then 2 tabs daily x 4 days Patient not taking: Reported on 04/05/2015 10/13/14   Loren Racer, MD  SUMAtriptan (IMITREX) 50 MG tablet Take 1 tablet (50 mg total) by mouth every 2 (two) hours as needed for migraine (2 doses per 24 hours max). May repeat in 2 hours if headache persists or recurs. 04/06/15   Linwood Dibbles, MD   BP 116/68 mmHg  Pulse 70  Temp(Src)  98.5 F (36.9 C) (Oral)  Resp 14  Ht  (1.702 m)  Wt 145 lb (65.772 kg)  BMI 22.71 kg/m2  SpO2 92%  LMP 03/28/2015 (Approximate) Physical Exam  Constitutional: She appears well-developed and well-nourished. No distress.  HENT:  Head: Normocephalic and atraumatic.  Right Ear: External ear normal.  Left Ear: External ear normal.  Eyes: Conjunctivae are normal. Right eye exhibits no discharge. Left eye exhibits no discharge. No scleral icterus.  Neck: Neck supple. No tracheal deviation present.  Cardiovascular: Normal rate, regular rhythm and intact distal pulses.   Pulmonary/Chest: Effort normal and breath sounds normal. No stridor. No respiratory distress. She has no wheezes. She has no rales.  Abdominal: Soft. Bowel  sounds are normal. She exhibits no distension. There is no tenderness. There is no rebound and no guarding.  Musculoskeletal: She exhibits no edema or tenderness.  Neurological: She is alert. She has normal strength. No cranial nerve deficit (no facial droop, extraocular movements intact, no slurred speech) or sensory deficit. She exhibits normal muscle tone. She displays no seizure activity. Coordination normal.  Skin: Skin is warm and dry. No rash noted.  Psychiatric: She has a normal mood and affect.  Nursing note and vitals reviewed.   ED Course  Procedures (including critical care time) Labs Review Labs Reviewed  I-STAT CHEM 8, ED - Abnormal; Notable for the following:    Potassium 3.3 (*)    All other components within normal limits  I-STAT BETA HCG BLOOD, ED ([facility], [facility], [facility] ONLY)    Imaging Review Dg Chest 2 View  04/05/2015  CLINICAL DATA:  34 year old female with right-sided chest pain today. EXAM: CHEST  2 VIEW COMPARISON:  Chest x-ray 10/13/2014. FINDINGS: Lung volumes are normal. No consolidative airspace disease. No pleural effusions. No pneumothorax. No pulmonary nodule or mass noted. Pulmonary vasculature and the cardiomediastinal silhouette are within normal limits. IMPRESSION: No radiographic evidence of acute cardiopulmonary disease. Electronically Signed   By: Trudie Reed M.D.   On: 04/05/2015 23:34   Ct Head Wo Contrast  04/06/2015  CLINICAL DATA:  Chronic migraine headaches. Nausea, phonophobia and photophobia. Loss of consciousness. Initial encounter. EXAM: CT HEAD WITHOUT CONTRAST TECHNIQUE: Contiguous axial images were obtained from the base of the skull through the vertex without intravenous contrast. COMPARISON:  None. FINDINGS: There is no evidence of acute infarction, mass lesion, or intra- or extra-axial hemorrhage on CT. The posterior fossa, including the cerebellum, brainstem and fourth ventricle, is within normal limits. The third and lateral ventricles, and  basal ganglia are unremarkable in appearance. The cerebral hemispheres are symmetric in appearance, with normal gray-white differentiation. No mass effect or midline shift is seen. There is no evidence of fracture; visualized osseous structures are unremarkable in appearance. The visualized portions of the orbits are within normal limits. The paranasal sinuses and mastoid air cells are well-aerated. No significant soft tissue abnormalities are seen. IMPRESSION: Unremarkable noncontrast CT of the head. Electronically Signed   By: Roanna Raider M.D.   On: 04/06/2015 00:08   I have personally reviewed and evaluated these images and lab results as part of my medical decision-making.  EKG Normal sinus rhythm, rate of 81 Normal axis, normal intervals  MDM   Final diagnoses:  Other migraine without status migrainosus, not intractable  Chest pain, unspecified chest pain type    Sx atypical for cardiac disease.  PERC negative.  Doubt serious cause for her chest pain.  Migraine improved with treatment.  Will dc home with  rx for imitrex    Linwood DibblesJon Elois Averitt, MD 04/06/15 [phone]

## 2015-04-06 LAB — I-STAT BETA HCG BLOOD, ED (MC, WL, AP ONLY)

## 2015-04-06 LAB — I-STAT CHEM 8, ED
BUN: 17 mg/dL (ref 6–20)
CALCIUM ION: 1.17 mmol/L (ref 1.12–1.23)
Chloride: 104 mmol/L (ref 101–111)
Creatinine, Ser: 0.7 mg/dL (ref 0.44–1.00)
Glucose, Bld: 99 mg/dL (ref 65–99)
HCT: 37 % (ref 36.0–46.0)
HEMOGLOBIN: 12.6 g/dL (ref 12.0–15.0)
Potassium: 3.3 mmol/L — ABNORMAL LOW (ref 3.5–5.1)
SODIUM: 138 mmol/L (ref 135–145)
TCO2: 24 mmol/L (ref 0–100)

## 2015-04-06 MED ORDER — SUMATRIPTAN SUCCINATE 50 MG PO TABS: 50.0000 mg | ORAL_TABLET | ORAL | Status: DC | PRN

## 2015-04-06 NOTE — Discharge Instructions (Signed)

## 2015-04-07 ENCOUNTER — Ambulatory Visit: Payer: BLUE CROSS/BLUE SHIELD

## 2015-04-10 ENCOUNTER — Ambulatory Visit: Payer: BC Managed Care – PPO | Admitting: Family Medicine

## 2015-04-17 ENCOUNTER — Ambulatory Visit: Payer: BC Managed Care – PPO | Admitting: Family Medicine

## 2015-08-17 ENCOUNTER — Encounter: Payer: Self-pay | Admitting: Family Medicine

## 2015-08-17 ENCOUNTER — Ambulatory Visit (INDEPENDENT_AMBULATORY_CARE_PROVIDER_SITE_OTHER): Payer: BC Managed Care – PPO | Admitting: Family Medicine

## 2015-08-17 VITALS — BP 110/60 | HR 80 | Ht 67.0 in | Wt 145.0 lb

## 2015-08-17 DIAGNOSIS — J0100 Acute maxillary sinusitis, unspecified: Secondary | ICD-10-CM | POA: Diagnosis not present

## 2015-08-17 MED ORDER — AMOXICILLIN 500 MG PO CAPS
500.0000 mg | ORAL_CAPSULE | Freq: Three times a day (TID) | ORAL | Status: DC
Start: 2015-08-17 — End: 2016-03-01

## 2015-08-17 NOTE — Progress Notes (Signed)
Name: Misty Archer   MRN: [id number]    DOB: April 27, 1981   Date:08/17/2015       Progress Note  Subjective  Chief Complaint  Chief Complaint  Patient presents with  . Sinusitis    congestion and facial pressure- otc not helping    Sinusitis This is a new problem. The current episode started in the past 7 days. The problem has been waxing and waning since onset. There has been no fever. Her pain is at a severity of 3/10. The pain is mild. Associated symptoms include chills, headaches, a hoarse voice, sinus pressure, sneezing and a sore throat. Pertinent negatives include no congestion, coughing, diaphoresis, ear pain, neck pain or shortness of breath. Past treatments include acetaminophen and oral decongestants. The treatment provided no relief.    No problem-specific assessment & plan notes found for this encounter.   Past Medical History  Diagnosis Date  . Asthma     Past Surgical History  Procedure Laterality Date  . Tonsillectomy    . Tubal ligation      Family History  Problem Relation Age of Onset  . Diabetes Mother   . Hypertension Mother   . Diabetes Father   . Hypertension Father     Social History   Social History  . Marital Status: Single    Spouse Name: N/A  . Number of Children: N/A  . Years of Education: N/A   Occupational History  . Not on file.   Social History Main Topics  . Smoking status: Never Smoker   . Smokeless tobacco: Not on file  . Alcohol Use: No  . Drug Use: No  . Sexual Activity: Yes   Other Topics Concern  . Not on file   Social History Narrative   ** Merged History Encounter **        Allergies  Allergen Reactions  . Codeine Rash  . Latex Rash     Review of Systems  Constitutional: Positive for chills. Negative for fever, weight loss, malaise/fatigue and diaphoresis.  HENT: Positive for hoarse voice, sinus pressure, sneezing and sore throat. Negative for congestion, ear discharge and ear pain.   Eyes: Negative for  blurred vision.  Respiratory: Negative for cough, sputum production, shortness of breath and wheezing.   Cardiovascular: Negative for chest pain, palpitations and leg swelling.  Gastrointestinal: Negative for heartburn, nausea, abdominal pain, diarrhea, constipation, blood in stool and melena.  Genitourinary: Negative for dysuria, urgency, frequency and hematuria.  Musculoskeletal: Negative for myalgias, back pain, joint pain and neck pain.  Skin: Negative for rash.  Neurological: Positive for headaches. Negative for dizziness, tingling, sensory change and focal weakness.  Endo/Heme/Allergies: Negative for environmental allergies and polydipsia. Does not bruise/bleed easily.  Psychiatric/Behavioral: Negative for depression and suicidal ideas. The patient is not nervous/anxious and does not have insomnia.      Objective  Filed Vitals:   08/17/15 1112  BP: 110/60  Pulse: 80  Height:  (1.702 m)  Weight: 145 lb (65.772 kg)    Physical Exam  Constitutional: She is well-developed, well-nourished, and in no distress. No distress.  HENT:  Head: Normocephalic and atraumatic.  Right Ear: External ear normal.  Left Ear: External ear normal.  Nose: Right sinus exhibits maxillary sinus tenderness. Left sinus exhibits maxillary sinus tenderness.  Mouth/Throat: Oropharynx is clear and moist.  Eyes: Conjunctivae and EOM are normal. Pupils are equal, round, and reactive to light. Right eye exhibits no discharge. Left eye exhibits no discharge.  Neck: Normal  range of motion. Neck supple. No JVD present. No thyromegaly present.  Cardiovascular: Normal rate, regular rhythm, normal heart sounds and intact distal pulses.  Exam reveals no gallop and no friction rub.   No murmur heard. Pulmonary/Chest: Effort normal and breath sounds normal. She has no wheezes. She has no rales.  Abdominal: Soft. Bowel sounds are normal. She exhibits no mass. There is no tenderness. There is no guarding.   Musculoskeletal: Normal range of motion. She exhibits no edema.  Lymphadenopathy:    She has no cervical adenopathy.  Neurological: She is alert.  Skin: Skin is warm and dry. She is not diaphoretic.  Psychiatric: Mood and affect normal.  Nursing note and vitals reviewed.     Assessment & Plan  Problem List Items Addressed This Visit    None    Visit Diagnoses    Acute maxillary sinusitis, recurrence not specified    -  Primary    suggest sudafed/flonase/mucinex    Relevant Medications    amoxicillin (AMOXIL) 500 MG capsule         Dr. Hayden Rasmusseneanna Jones [facility]  08/17/2015

## 2016-03-01 ENCOUNTER — Ambulatory Visit (INDEPENDENT_AMBULATORY_CARE_PROVIDER_SITE_OTHER): Payer: BC Managed Care – PPO | Admitting: Family Medicine

## 2016-03-01 ENCOUNTER — Encounter: Payer: Self-pay | Admitting: Family Medicine

## 2016-03-01 VITALS — BP 112/80 | HR 82 | Ht 67.0 in | Wt 147.0 lb

## 2016-03-01 DIAGNOSIS — J4521 Mild intermittent asthma with (acute) exacerbation: Secondary | ICD-10-CM

## 2016-03-01 MED ORDER — ALBUTEROL SULFATE HFA 108 (90 BASE) MCG/ACT IN AERS: 2.0000 | INHALATION_SPRAY | Freq: Four times a day (QID) | RESPIRATORY_TRACT | 11 refills | Status: DC | PRN

## 2016-03-01 MED ORDER — PREDNISONE 10 MG PO TABS: 10.0000 mg | ORAL_TABLET | Freq: Every day | ORAL | 0 refills | Status: DC

## 2016-03-01 NOTE — Progress Notes (Signed)
Name: Misty Archer   MRN: [id number]    DOB: 30-May-1981   Date:03/01/2016       Progress Note  Subjective  Chief Complaint  Chief Complaint  Patient presents with  . Shortness of Breath    hx of asthma- felt SOB this am- inhalers not working well- EMT were called to school where she received O2- no breathing treatment. wants long acting inhaler    Shortness of Breath  This is a recurrent problem. The current episode started more than 1 year ago. The problem occurs intermittently. The problem has been waxing and waning. The average episode lasts 1 hour. Associated symptoms include chest pain. Pertinent negatives include no abdominal pain, claudication, coryza, ear pain, fever, headaches, hemoptysis, leg pain, leg swelling, neck pain, orthopnea, PND, rash, rhinorrhea, sore throat, sputum production, swollen glands, syncope, vomiting or wheezing. Nothing aggravates the symptoms. The patient has no known risk factors for DVT/PE. She has tried beta agonist inhalers for the symptoms. The treatment provided mild relief. There is no history of allergies, aspirin allergies, asthma, bronchiolitis, CAD, chronic lung disease, COPD, DVT, a heart failure, PE, pneumonia or a recent surgery.    No problem-specific Assessment & Plan notes found for this encounter.   Past Medical History:  Diagnosis Date  . Asthma     Past Surgical History:  Procedure Laterality Date  . TONSILLECTOMY    . TUBAL LIGATION      Family History  Problem Relation Age of Onset  . Diabetes Mother   . Hypertension Mother   . Diabetes Father   . Hypertension Father     Social History   Social History  . Marital status: Single    Spouse name: N/A  . Number of children: N/A  . Years of education: N/A   Occupational History  . Not on file.   Social History Main Topics  . Smoking status: Never Smoker  . Smokeless tobacco: Not on file  . Alcohol use No  . Drug use: No  . Sexual activity: Yes   Other Topics  Concern  . Not on file   Social History Narrative   ** Merged History Encounter **        Allergies  Allergen Reactions  . Codeine Rash  . Latex Rash     Review of Systems  Constitutional: Negative for chills, fever, malaise/fatigue and weight loss.  HENT: Negative for ear discharge, ear pain, rhinorrhea and sore throat.   Eyes: Negative for blurred vision.  Respiratory: Positive for shortness of breath. Negative for cough, hemoptysis, sputum production and wheezing.   Cardiovascular: Positive for chest pain. Negative for palpitations, orthopnea, claudication, leg swelling, syncope and PND.  Gastrointestinal: Negative for abdominal pain, blood in stool, constipation, diarrhea, heartburn, melena, nausea and vomiting.  Genitourinary: Negative for dysuria, frequency, hematuria and urgency.  Musculoskeletal: Negative for back pain, joint pain, myalgias and neck pain.  Skin: Negative for rash.  Neurological: Negative for dizziness, tingling, sensory change, focal weakness and headaches.  Endo/Heme/Allergies: Negative for environmental allergies and polydipsia. Does not bruise/bleed easily.  Psychiatric/Behavioral: Negative for depression and suicidal ideas. The patient is not nervous/anxious and does not have insomnia.      Objective  Vitals:   03/01/16 1429  BP: 112/80  Pulse: 82  SpO2: 97%  Weight: 147 lb (66.7 kg)  Height: 5\' 7"  (1.702 m)    Physical Exam  Constitutional: She is well-developed, well-nourished, and in no distress. No distress.  HENT:  Head: Normocephalic and  atraumatic.  Right Ear: External ear normal.  Left Ear: External ear normal.  Nose: Nose normal.  Mouth/Throat: Oropharynx is clear and moist.  Eyes: Conjunctivae and EOM are normal. Pupils are equal, round, and reactive to light. Right eye exhibits no discharge. Left eye exhibits no discharge.  Neck: Normal range of motion. Neck supple. No JVD present. No thyromegaly present.  Cardiovascular:  Normal rate, regular rhythm, normal heart sounds and intact distal pulses.  Exam reveals no gallop and no friction rub.   No murmur heard. Pulmonary/Chest: Effort normal. No respiratory distress. She has wheezes. She has no rales. She exhibits no tenderness.  Abdominal: Soft. Bowel sounds are normal. She exhibits no mass. There is no tenderness. There is no guarding.  Musculoskeletal: Normal range of motion. She exhibits no edema.  Lymphadenopathy:    She has no cervical adenopathy.  Neurological: She is alert.  Skin: Skin is warm and dry. She is not diaphoretic.  Psychiatric: Mood and affect normal.  Nursing note and vitals reviewed.     Assessment & Plan  Problem List Items Addressed This Visit    None    Visit Diagnoses    Reactive airway disease, mild intermittent, with acute exacerbation    -  Primary   samle [id number]   Relevant Medications   predniSONE (DELTASONE) 10 MG tablet        Dr. [facility]den [facility]  03/01/16

## 2016-06-24 ENCOUNTER — Ambulatory Visit (INDEPENDENT_AMBULATORY_CARE_PROVIDER_SITE_OTHER): Payer: BC Managed Care – PPO | Admitting: Family Medicine

## 2016-06-24 ENCOUNTER — Encounter: Payer: Self-pay | Admitting: Family Medicine

## 2016-06-24 VITALS — BP 120/80 | HR 76 | Temp 98.7°F | Ht 67.0 in | Wt 151.0 lb

## 2016-06-24 DIAGNOSIS — J219 Acute bronchiolitis, unspecified: Secondary | ICD-10-CM | POA: Diagnosis not present

## 2016-06-24 DIAGNOSIS — J0100 Acute maxillary sinusitis, unspecified: Secondary | ICD-10-CM | POA: Diagnosis not present

## 2016-06-24 MED ORDER — ALBUTEROL SULFATE HFA 108 (90 BASE) MCG/ACT IN AERS: 2.0000 | INHALATION_SPRAY | Freq: Four times a day (QID) | RESPIRATORY_TRACT | 11 refills | Status: DC | PRN

## 2016-06-24 MED ORDER — AMOXICILLIN 500 MG PO CAPS
500.0000 mg | ORAL_CAPSULE | Freq: Three times a day (TID) | ORAL | 0 refills | Status: DC
Start: 2016-06-24 — End: 2017-08-14

## 2016-06-24 MED ORDER — ALBUTEROL SULFATE (2.5 MG/3ML) 0.083% IN NEBU: 2.5000 mg | INHALATION_SOLUTION | Freq: Four times a day (QID) | RESPIRATORY_TRACT | 12 refills | Status: DC | PRN

## 2016-06-24 NOTE — Progress Notes (Signed)
Name: Misty Archer   MRN: [id number]    DOB: [birth year]   Date:06/24/2016       Progress Note  Subjective  Chief Complaint  Chief Complaint  Patient presents with  . Sinusitis    R) facial pressure and ear pain- inhalers are helping but needs Albuterol neb solution    Sinusitis  This is a new problem. The current episode started in the past 7 days. The problem has been waxing and waning since onset. There has been no fever. Associated symptoms include chills, congestion, coughing, headaches, a hoarse voice and sinus pressure. Pertinent negatives include no diaphoresis, ear pain, neck pain, shortness of breath, sneezing, sore throat or swollen glands. Past treatments include nothing. The treatment provided mild relief.  Cough  This is a new problem. The current episode started in the past 7 days. The problem has been waxing and waning. Associated symptoms include chills, headaches, nasal congestion and postnasal drip. Pertinent negatives include no chest pain, ear congestion, ear pain, fever, heartburn, myalgias, rash, sore throat, shortness of breath, weight loss or wheezing. There is no history of environmental allergies.    No problem-specific Assessment & Plan notes found for this encounter.   Past Medical History:  Diagnosis Date  . Asthma     Past Surgical History:  Procedure Laterality Date  . TONSILLECTOMY    . TUBAL LIGATION      Family History  Problem Relation Age of Onset  . Diabetes Mother   . Hypertension Mother   . Diabetes Father   . Hypertension Father     Social History   Social History  . Marital status: Single    Spouse name: N/A  . Number of children: N/A  . Years of education: N/A   Occupational History  . Not on file.   Social History Main Topics  . Smoking status: Never Smoker  . Smokeless tobacco: Not on file  . Alcohol use No  . Drug use: No  . Sexual activity: Yes   Other Topics Concern  . Not on file   Social History Narrative   ** Merged History Encounter **        Allergies  Allergen Reactions  . Codeine Rash  . Latex Rash     Review of Systems  Constitutional: Positive for chills. Negative for diaphoresis, fever, malaise/fatigue and weight loss.  HENT: Positive for congestion, hoarse voice, postnasal drip and sinus pressure. Negative for ear discharge, ear pain, sneezing and sore throat.   Eyes: Negative for blurred vision.  Respiratory: Positive for cough. Negative for sputum production, shortness of breath and wheezing.   Cardiovascular: Negative for chest pain, palpitations and leg swelling.  Gastrointestinal: Negative for abdominal pain, blood in stool, constipation, diarrhea, heartburn, melena and nausea.  Genitourinary: Negative for dysuria, frequency, hematuria and urgency.  Musculoskeletal: Negative for back pain, joint pain, myalgias and neck pain.  Skin: Negative for rash.  Neurological: Positive for headaches. Negative for dizziness, tingling, sensory change and focal weakness.  Endo/Heme/Allergies: Negative for environmental allergies and polydipsia. Does not bruise/bleed easily.  Psychiatric/Behavioral: Negative for depression and suicidal ideas. The patient is not nervous/anxious and does not have insomnia.      Objective  Vitals:   06/24/16 1033  BP: 120/80  Pulse: 76  Temp: 98.7 F (37.1 C)  TempSrc: Oral  SpO2: 99%  Weight: 151 lb (68.5 kg)  Height: 5\' 7"  (1.702 m)    Physical Exam  Constitutional: She is well-developed, well-nourished, and in no  distress. No distress.  HENT:  Head: Normocephalic and atraumatic.  Right Ear: External ear normal.  Left Ear: External ear normal.  Nose: Nose normal.  Mouth/Throat: Oropharynx is clear and moist.  Eyes: Conjunctivae and EOM are normal. Pupils are equal, round, and reactive to light. Right eye exhibits no discharge. Left eye exhibits no discharge.  Neck: Normal range of motion. Neck supple. No JVD present. No thyromegaly  present.  Cardiovascular: Normal rate, regular rhythm, normal heart sounds and intact distal pulses.  Exam reveals no gallop and no friction rub.   No murmur heard. Pulmonary/Chest: Effort normal and breath sounds normal.  Abdominal: Soft. Bowel sounds are normal. She exhibits no mass. There is no tenderness. There is no guarding.  Musculoskeletal: Normal range of motion. She exhibits no edema.  Lymphadenopathy:    She has no cervical adenopathy.  Neurological: She is alert. She has normal reflexes.  Skin: Skin is warm and dry. She is not diaphoretic.  Psychiatric: Mood and affect normal.  Nursing note and vitals reviewed.     Assessment & Plan  Problem List Items Addressed This Visit    None    Visit Diagnoses    Acute maxillary sinusitis, recurrence not specified    -  Primary   Relevant Medications   amoxicillin (AMOXIL) 500 MG capsule   albuterol (PROVENTIL) (2.5 MG/3ML) 0.083% nebulizer solution   albuterol (PROVENTIL HFA;VENTOLIN HFA) 108 (90 Base) MCG/ACT inhaler   Bronchiolitis       Relevant Medications   amoxicillin (AMOXIL) 500 MG capsule   albuterol (PROVENTIL) (2.5 MG/3ML) 0.083% nebulizer solution   albuterol (PROVENTIL HFA;VENTOLIN HFA) 108 (90 Base) MCG/ACT inhaler        Dr. [facility]  06/24/16

## 2016-07-07 ENCOUNTER — Other Ambulatory Visit: Payer: Self-pay | Admitting: Family Medicine

## 2017-01-16 IMAGING — CT CT HEAD W/O CM
2 series · 15 of 30 positions shown, 17 images · non-contrast
Comparison: None.

CLINICAL DATA: Chronic migraine headaches. Nausea, phonophobia and
photophobia. Loss of consciousness. Initial encounter.

EXAM:
CT HEAD WITHOUT CONTRAST
TECHNIQUE: Contiguous axial images were obtained from the base of the skull
through the vertex without intravenous contrast.

[Series 2: head without · axial · non-contrast · 0.41mm/px · z∈[-150,-40]mm · 7 of 30 slices shown, 9 images]
[im 4/30  brain]
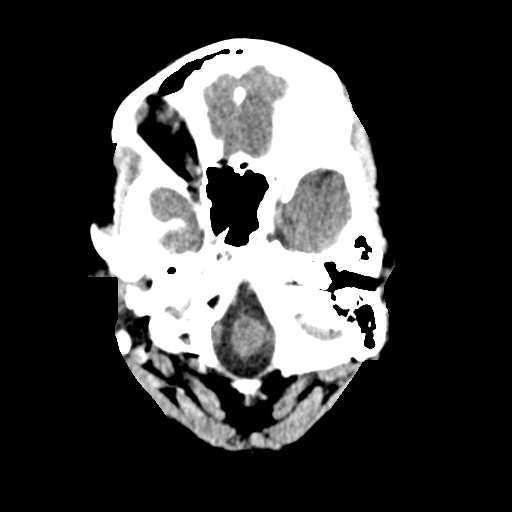
[im 4/30  bone]
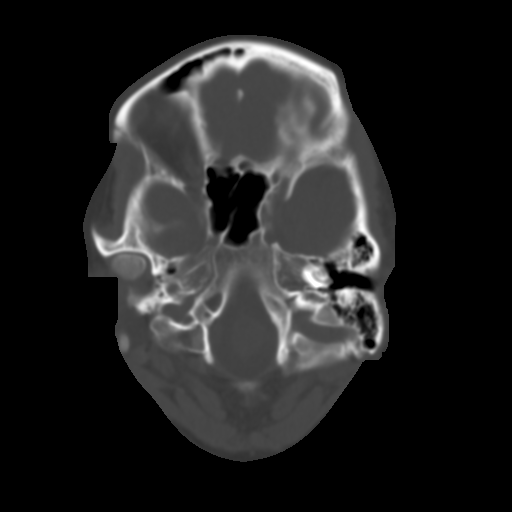
[im 8/30  brain]
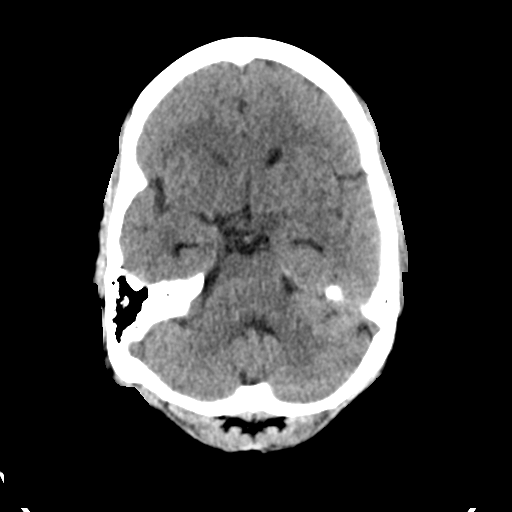
[im 11/30  brain]
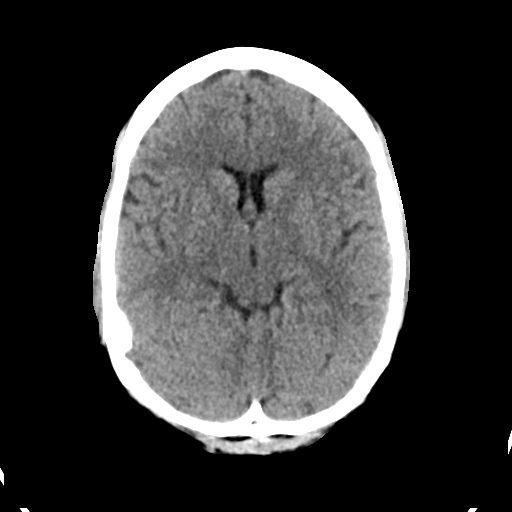
[im 15/30  brain]
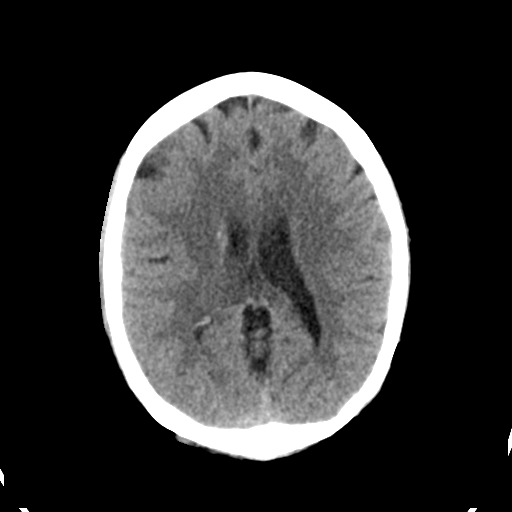
[im 19/30  brain]
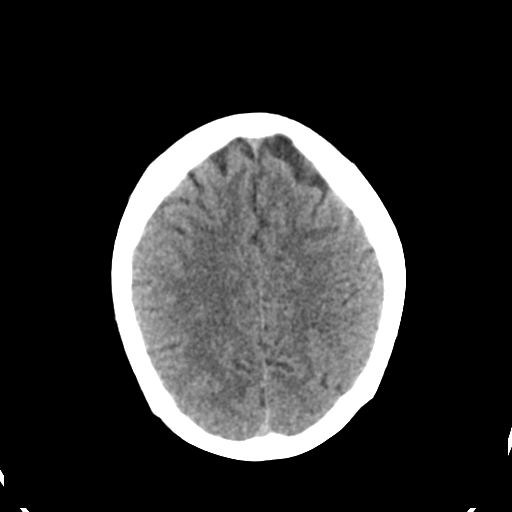
[im 19/30  bone]
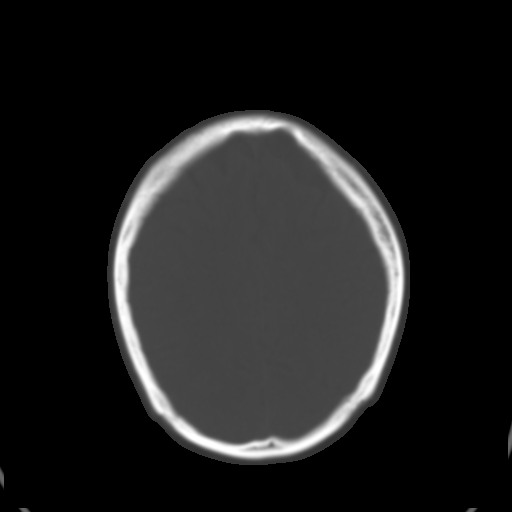
[im 22/30  brain]
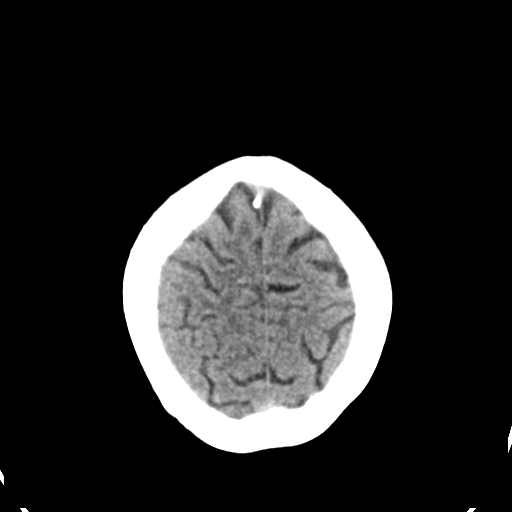
[im 26/30  brain]
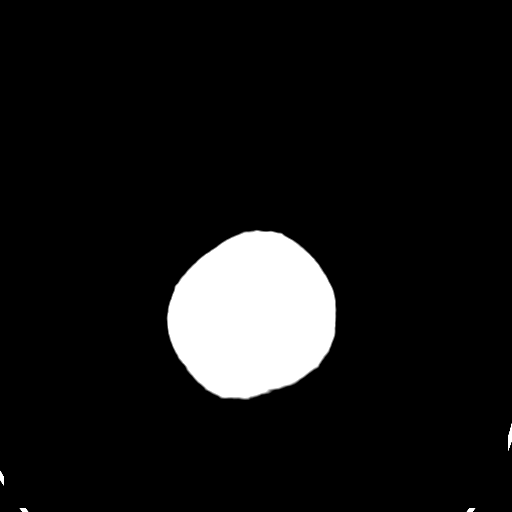

[Series 3: head bone · axial · 0.41mm/px · z∈[-151,-33]mm · 8 of 75 slices shown]
[im 8/75  bone]
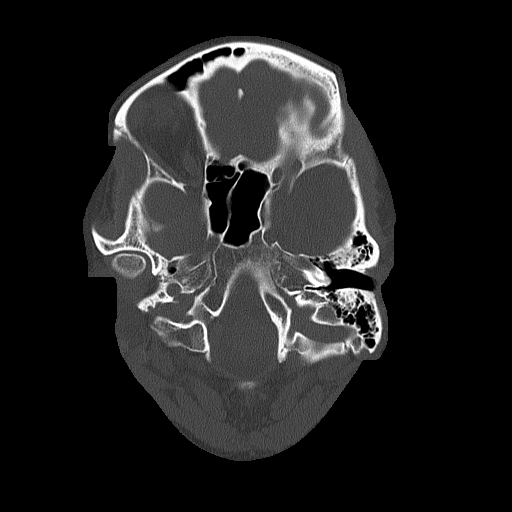
[im 15/75  bone]
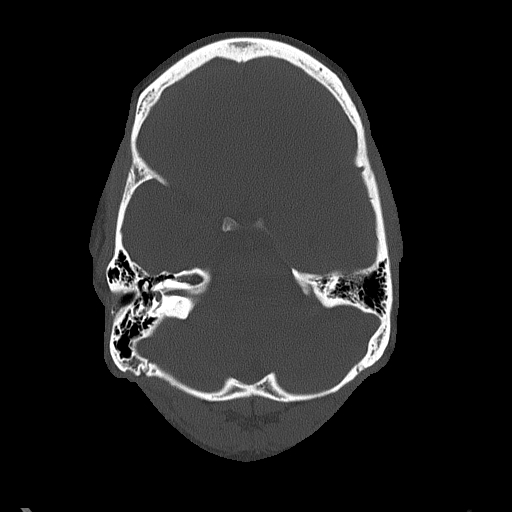
[im 23/75  bone]
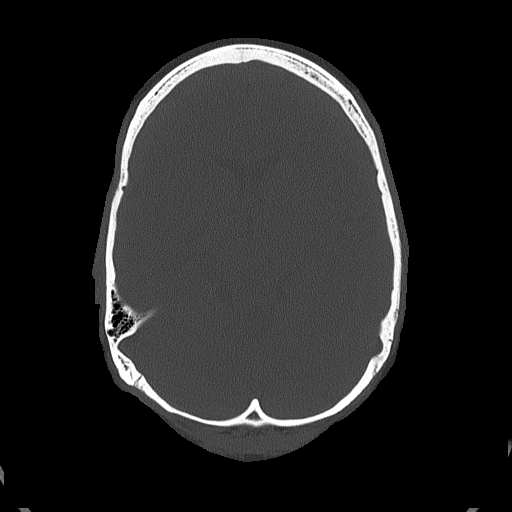
[im 34/75  bone]
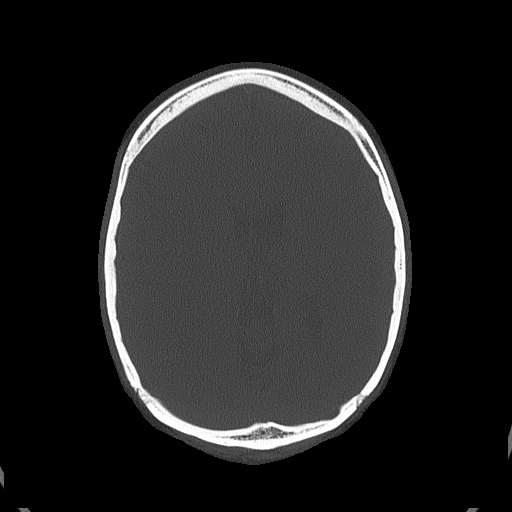
[im 41/75  bone]
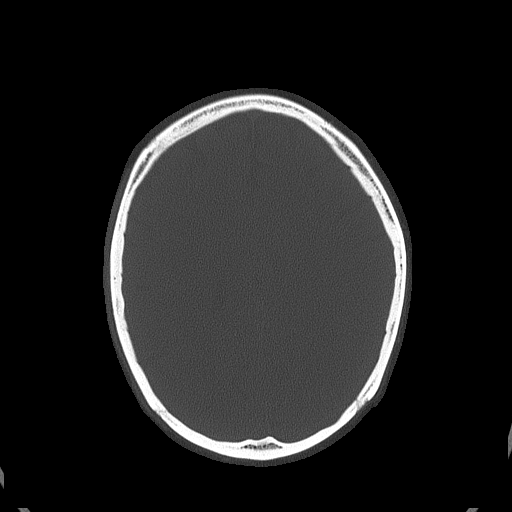
[im 52/75  bone]
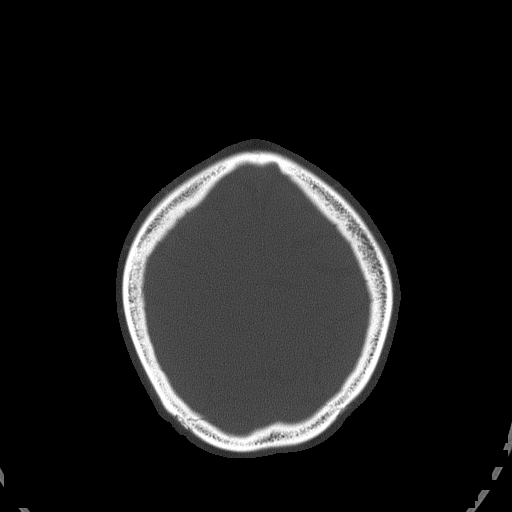
[im 60/75  bone]
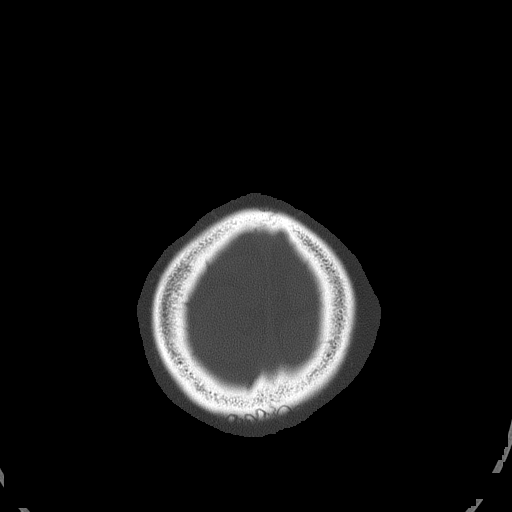
[im 67/75  bone]
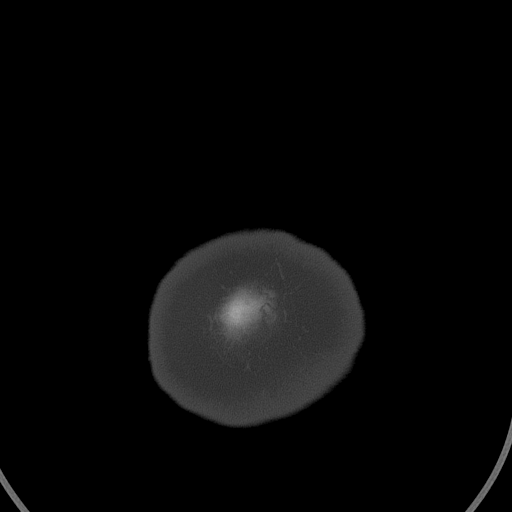

[15 of 30 positions shown; findings below may reference images not displayed]

FINDINGS: There is no evidence of acute infarction, mass lesion, or intra- or
extra-axial hemorrhage on CT.

The posterior fossa, including the cerebellum, brainstem and fourth
ventricle, is within normal limits. The third and lateral
ventricles, and basal ganglia are unremarkable in appearance. The
cerebral hemispheres are symmetric in appearance, with normal
gray-white differentiation. No mass effect or midline shift is seen.

There is no evidence of fracture; visualized osseous structures are
unremarkable in appearance. The visualized portions of the orbits
are within normal limits. The paranasal sinuses and mastoid air
cells are well-aerated. No significant soft tissue abnormalities are
seen.
IMPRESSION: Unremarkable noncontrast CT of the head.

## 2017-08-14 ENCOUNTER — Ambulatory Visit: Payer: BC Managed Care – PPO | Admitting: Family Medicine

## 2017-08-14 ENCOUNTER — Encounter: Payer: Self-pay | Admitting: Family Medicine

## 2017-08-14 VITALS — BP 120/82 | HR 72 | Temp 98.7°F | Ht 67.0 in | Wt 152.0 lb

## 2017-08-14 DIAGNOSIS — J0100 Acute maxillary sinusitis, unspecified: Secondary | ICD-10-CM | POA: Diagnosis not present

## 2017-08-14 DIAGNOSIS — J219 Acute bronchiolitis, unspecified: Secondary | ICD-10-CM | POA: Diagnosis not present

## 2017-08-14 DIAGNOSIS — J4520 Mild intermittent asthma, uncomplicated: Secondary | ICD-10-CM | POA: Diagnosis not present

## 2017-08-14 MED ORDER — ALBUTEROL SULFATE HFA 108 (90 BASE) MCG/ACT IN AERS: 2.0000 | INHALATION_SPRAY | Freq: Four times a day (QID) | RESPIRATORY_TRACT | 11 refills | Status: DC | PRN

## 2017-08-14 MED ORDER — AMOXICILLIN-POT CLAVULANATE 875-125 MG PO TABS: 1.0000 | ORAL_TABLET | Freq: Two times a day (BID) | ORAL | 0 refills | Status: DC

## 2017-08-14 NOTE — Progress Notes (Signed)
Name: Misty Archer   MRN: [id number]    DOB: January 15, 1981   Date:08/14/2017       Progress Note  Subjective  Chief Complaint  Chief Complaint  Patient presents with  . Sinusitis    facial pressure, tender under eyes and pressure behind eyes, teeth hurting., no cough, no fever. 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 The current episode started in the past 7 days (Friday night). The problem has been gradually worsening since onset. There has been no fever. Her pain is at a severity of 4/10 (like someone punched me in the face). The pain is mild. Associated symptoms include chills, congestion, ear pain, headaches, sinus pressure and sneezing. Pertinent negatives include no coughing, diaphoresis, hoarse voice, neck pain, shortness of breath, sore throat or swollen glands. Past treatments include acetaminophen and oral decongestants. The treatment provided mild relief.    No problem-specific Assessment & Plan notes found for this encounter.   Past Medical History:  Diagnosis Date  . Asthma     Past Surgical History:  Procedure Laterality Date  . TONSILLECTOMY    . TUBAL LIGATION      Family History  Problem Relation Age of Onset  . Diabetes Mother   . Hypertension Mother   . Diabetes Father   . Hypertension Father     Social History   Socioeconomic History  . Marital status: Single    Spouse name: Not on file  . Number of children: Not on file  . Years of education: Not on file  . Highest education level: Not on file  Social Needs  . Financial resource strain: Not on file  . Food insecurity - worry: Not on file  . Food insecurity - inability: Not on file  . Transportation needs - medical: Not on file  . Transportation needs - non-medical: Not on file  Occupational History  . Not on file  Tobacco Use  . Smoking status: Never Smoker  . Smokeless tobacco: Never Used  Substance and Sexual Activity  . Alcohol use: No    Alcohol/week: 0.0 oz  . Drug  use: No  . Sexual activity: Yes  Other Topics Concern  . Not on file  Social History Narrative   ** Merged History Encounter **        Allergies  Allergen Reactions  . Codeine Rash  . Latex Rash    Outpatient Medications Prior to Visit  Medication Sig Dispense Refill  . acetaminophen (TYLENOL) 500 MG tablet Take 500 mg by mouth every 6 (six) hours as needed. For pain or headache     . albuterol (PROVENTIL) (2.5 MG/3ML) 0.083% nebulizer solution Take 3 mLs (2.5 mg total) by nebulization every 6 (six) hours as needed for wheezing or shortness of breath. 75 mL 12  . albuterol (PROVENTIL HFA;VENTOLIN HFA) 108 (90 Base) MCG/ACT inhaler Inhale 2 puffs into the lungs every 6 (six) hours as needed for wheezing or shortness of breath. 1 Inhaler 11  . amoxicillin (AMOXIL) 500 MG capsule Take 1 capsule (500 mg total) by mouth 3 (three) times daily. 30 capsule 0   No facility-administered medications prior to visit.     Review of Systems  Constitutional: Positive for chills. Negative for diaphoresis, fever, malaise/fatigue and weight loss.  HENT: Positive for congestion, ear pain, sinus pressure and sneezing. Negative for ear discharge, hoarse voice and sore throat.   Eyes: Negative for blurred vision.  Respiratory: Negative for cough, sputum  production, shortness of breath and wheezing.   Cardiovascular: Negative for chest pain, palpitations and leg swelling.  Gastrointestinal: Negative for abdominal pain, blood in stool, constipation, diarrhea, heartburn, melena and nausea.  Genitourinary: Negative for dysuria, frequency, hematuria and urgency.  Musculoskeletal: Negative for back pain, joint pain, myalgias and neck pain.  Skin: Negative for rash.  Neurological: Positive for headaches. Negative for dizziness, tingling, sensory change and focal weakness.  Endo/Heme/Allergies: Negative for environmental allergies and polydipsia. Does not bruise/bleed easily.  Psychiatric/Behavioral: Negative  for depression and suicidal ideas. The patient is not nervous/anxious and does not have insomnia.      Objective  Vitals:   08/14/17 1012  BP: 120/82  Pulse: 72  Temp: 98.7 F (37.1 C)  TempSrc: Oral  Weight: 152 lb (68.9 kg)  Height: 5\' 7"  (1.702 m)    Physical Exam  Constitutional: She is well-developed, well-nourished, and in no distress. No distress.  HENT:  Head: Normocephalic and atraumatic.  Right Ear: Tympanic membrane, external ear and ear canal normal.  Left Ear: Tympanic membrane, external ear and ear canal normal.  Nose: Mucosal edema present. Right sinus exhibits maxillary sinus tenderness and frontal sinus tenderness. Left sinus exhibits maxillary sinus tenderness and frontal sinus tenderness.  Mouth/Throat: Posterior oropharyngeal erythema present. No oropharyngeal exudate, posterior oropharyngeal edema or tonsillar abscesses.  Eyes: Conjunctivae and EOM are normal. Pupils are equal, round, and reactive to light. Right eye exhibits no discharge. Left eye exhibits no discharge.  Neck: Normal range of motion. Neck supple. No JVD present. No thyromegaly present.  Cardiovascular: Normal rate, regular rhythm, normal heart sounds and intact distal pulses. Exam reveals no gallop and no friction rub.  No murmur heard. Pulmonary/Chest: Effort normal and breath sounds normal.  Abdominal: Soft. Bowel sounds are normal. She exhibits no mass. There is no tenderness. There is no guarding.  Musculoskeletal: Normal range of motion. She exhibits no edema.  Lymphadenopathy:       Head (right side): Submandibular adenopathy present.       Head (left side): Submandibular adenopathy present.    She has no cervical adenopathy.  tender  Neurological: She is alert. She has normal reflexes.  Skin: Skin is warm and dry. She is not diaphoretic.  Psychiatric: Mood and affect normal.  Nursing note and vitals reviewed.     Assessment & Plan  Problem List Items Addressed This Visit     None    Visit Diagnoses    Acute maxillary sinusitis, recurrence not specified    -  Primary   Relevant Medications   amoxicillin-clavulanate (AUGMENTIN) 875-125 MG tablet   albuterol (PROVENTIL HFA;VENTOLIN HFA) 108 (90 Base) MCG/ACT inhaler   Bronchiolitis       Relevant Medications   albuterol (PROVENTIL HFA;VENTOLIN HFA) 108 (90 Base) MCG/ACT inhaler   Mild intermittent asthma, unspecified whether complicated       Relevant Medications   albuterol (PROVENTIL HFA;VENTOLIN HFA) 108 (90 Base) MCG/ACT inhaler      Meds ordered this encounter  Medications  . amoxicillin-clavulanate (AUGMENTIN) 875-125 MG tablet    Sig: Take 1 tablet by mouth 2 (two) times daily.    Dispense:  20 tablet    Refill:  0  . albuterol (PROVENTIL HFA;VENTOLIN HFA) 108 (90 Base) MCG/ACT inhaler    Sig: Inhale 2 puffs into the lungs every 6 (six) hours as needed for wheezing or shortness of breath.    Dispense:  1 Inhaler    Refill:  11  Dr. Hayden [facility]uss[facility]  08/14/17

## 2017-10-20 ENCOUNTER — Ambulatory Visit: Payer: BC Managed Care – PPO | Admitting: Family Medicine

## 2017-10-20 ENCOUNTER — Encounter: Payer: Self-pay | Admitting: Family Medicine

## 2017-10-20 VITALS — BP 120/62 | HR 76 | Ht 67.0 in | Wt 152.0 lb

## 2017-10-20 DIAGNOSIS — J40 Bronchitis, not specified as acute or chronic: Secondary | ICD-10-CM

## 2017-10-20 DIAGNOSIS — J4521 Mild intermittent asthma with (acute) exacerbation: Secondary | ICD-10-CM

## 2017-10-20 MED ORDER — PREDNISONE 10 MG PO TABS: ORAL_TABLET | ORAL | 0 refills | Status: DC

## 2017-10-20 MED ORDER — IPRATROPIUM-ALBUTEROL 0.5-2.5 (3) MG/3ML IN SOLN: 3.0000 mL | Freq: Four times a day (QID) | RESPIRATORY_TRACT | 0 refills | Status: DC | PRN

## 2017-10-20 NOTE — Progress Notes (Signed)
Name: Misty Archer   MRN: [id number]    DOB: 09/27/1980   Date:10/20/2017       Progress Note  Subjective  Chief Complaint  Chief Complaint  Patient presents with  . Asthma    flare up due to pollen- Rx for pred and Duoneb    Asthma  She complains of chest tightness, cough, difficulty breathing, shortness of breath and wheezing. There is no frequent throat clearing, hemoptysis, hoarse voice or sputum production. This is a recurrent problem. The current episode started more than 1 month ago (month or so). The problem occurs intermittently. The cough is non-productive. Associated symptoms include sneezing and a sore throat. Pertinent negatives include no chest pain, dyspnea on exertion, ear pain, fever, headaches, heartburn, malaise/fatigue, myalgias, nasal congestion, postnasal drip, sweats or weight loss. Her symptoms are aggravated by pollen and occupational exposure. Her symptoms are alleviated by beta-agonist (albuterol). Her past medical history is significant for asthma.    No problem-specific Assessment & Plan notes found for this encounter.   Past Medical History:  Diagnosis Date  . Asthma     Past Surgical History:  Procedure Laterality Date  . TONSILLECTOMY    . TUBAL LIGATION      Family History  Problem Relation Age of Onset  . Diabetes Mother   . Hypertension Mother   . Diabetes Father   . Hypertension Father     Social History   Socioeconomic History  . Marital status: Single    Spouse name: Not on file  . Number of children: Not on file  . Years of education: Not on file  . Highest education level: Not on file  Occupational History  . Not on file  Social Needs  . Financial resource strain: Not on file  . Food insecurity:    Worry: Not on file    Inability: Not on file  . Transportation needs:    Medical: Not on file    Non-medical: Not on file  Tobacco Use  . Smoking status: Never Smoker  . Smokeless tobacco: Never Used  Substance and Sexual  Activity  . Alcohol use: No    Alcohol/week: 0.0 oz  . Drug use: No  . Sexual activity: Yes  Lifestyle  . Physical activity:    Days per week: Not on file    Minutes per session: Not on file  . Stress: Not on file  Relationships  . Social connections:    Talks on phone: Not on file    Gets together: Not on file    Attends religious service: Not on file    Active member of club or organization: Not on file    Attends meetings of clubs or organizations: Not on file    Relationship status: Not on file  . Intimate partner violence:    Fear of current or ex partner: Not on file    Emotionally abused: Not on file    Physically abused: Not on file    Forced sexual activity: Not on file  Other Topics Concern  . Not on file  Social History Narrative   ** Merged History Encounter **        Allergies  Allergen Reactions  . Codeine Rash  . Latex Rash    Outpatient Medications Prior to Visit  Medication Sig Dispense Refill  . acetaminophen (TYLENOL) 500 MG tablet Take 500 mg by mouth every 6 (six) hours as needed. For pain or headache     . albuterol (PROVENTIL HFA;VENTOLIN  HFA) 108 (90 Base) MCG/ACT inhaler Inhale 2 puffs into the lungs every 6 (six) hours as needed for wheezing or shortness of breath. 1 Inhaler 11  . albuterol (PROVENTIL) (2.5 MG/3ML) 0.083% nebulizer solution Take 3 mLs (2.5 mg total) by nebulization every 6 (six) hours as needed for wheezing or shortness of breath. 75 mL 12  . amoxicillin-clavulanate (AUGMENTIN) 875-125 MG tablet Take 1 tablet by mouth 2 (two) times daily. 20 tablet 0   No facility-administered medications prior to visit.     Review of Systems  Constitutional: Negative for chills, fever, malaise/fatigue and weight loss.  HENT: Positive for sneezing and sore throat. Negative for ear discharge, ear pain, hoarse voice and postnasal drip.   Eyes: Negative for blurred vision.  Respiratory: Positive for cough, shortness of breath and wheezing.  Negative for hemoptysis and sputum production.   Cardiovascular: Negative for chest pain, dyspnea on exertion, palpitations and leg swelling.  Gastrointestinal: Negative for abdominal pain, blood in stool, constipation, diarrhea, heartburn, melena and nausea.  Genitourinary: Negative for dysuria, frequency, hematuria and urgency.  Musculoskeletal: Negative for back pain, joint pain, myalgias and neck pain.  Skin: Negative for rash.  Neurological: Negative for dizziness, tingling, sensory change, focal weakness and headaches.  Endo/Heme/Allergies: Negative for environmental allergies and polydipsia. Does not bruise/bleed easily.  Psychiatric/Behavioral: Negative for depression and suicidal ideas. The patient is not nervous/anxious and does not have insomnia.      Objective  Vitals:   10/20/17 1452  BP: 120/62  Pulse: 76  SpO2: 99%  Weight: 152 lb (68.9 kg)  Height:  (1.702 m)    Physical Exam  Constitutional: She is oriented to person, place, and time. She appears well-developed and well-nourished.  HENT:  Head: Normocephalic.  Right Ear: External ear normal.  Left Ear: External ear normal.  Mouth/Throat: Oropharynx is clear and moist.  Eyes: Pupils are equal, round, and reactive to light. Conjunctivae and EOM are normal. Lids are everted and swept, no foreign bodies found. Left eye exhibits no hordeolum. No foreign body present in the left eye. Right conjunctiva is not injected. Left conjunctiva is not injected. No scleral icterus.  Neck: Normal range of motion. Neck supple. No JVD present. No tracheal deviation present. No thyromegaly present.  Cardiovascular: Normal rate, regular rhythm, normal heart sounds and intact distal pulses. Exam reveals no gallop and no friction rub.  No murmur heard. Pulmonary/Chest: Effort normal and breath sounds normal. No respiratory distress. She has no wheezes. She has no rales.  Abdominal: Soft. Bowel sounds are normal. She exhibits no mass.  There is no hepatosplenomegaly. There is no tenderness. There is no rebound and no guarding.  Musculoskeletal: Normal range of motion. She exhibits no edema or tenderness.  Lymphadenopathy:    She has no cervical adenopathy.  Neurological: She is alert and oriented to person, place, and time. She has normal strength. She displays normal reflexes. No cranial nerve deficit.  Skin: Skin is warm. No rash noted.  Psychiatric: She has a normal mood and affect. Her mood appears not anxious. She does not exhibit a depressed mood.  Nursing note and vitals reviewed.     Assessment & Plan  Problem List Items Addressed This Visit    None    Visit Diagnoses    Mild intermittent asthma with acute exacerbation    -  Primary   Relevant Medications   ipratropium-albuterol (DUONEB) 0.5-2.5 (3) MG/3ML SOLN   predniSONE (DELTASONE) 10 MG tablet   Bronchitis  Meds ordered this encounter  Medications  . ipratropium-albuterol (DUONEB) 0.5-2.5 (3) MG/3ML SOLN    Sig: Take 3 mLs by nebulization every 6 (six) hours as needed.    Dispense:  360 mL    Refill:  0  . predniSONE (DELTASONE) 10 MG tablet    Sig: Taper 6,6,6,5,5,5,4,4,3,3,2,2,1,1    Dispense:  60 tablet    Refill:  0    4,4,4,4,3,3,3,3,2,2,2,2,1,1,1,1      Dr. Elizabeth Sauer [facility]  10/20/17

## 2017-11-24 ENCOUNTER — Encounter: Payer: Self-pay | Admitting: Family Medicine

## 2017-11-24 ENCOUNTER — Ambulatory Visit (INDEPENDENT_AMBULATORY_CARE_PROVIDER_SITE_OTHER): Payer: BC Managed Care – PPO | Admitting: Family Medicine

## 2017-11-24 VITALS — BP 124/70 | HR 68 | Ht 67.0 in | Wt 152.0 lb

## 2017-11-24 DIAGNOSIS — Z111 Encounter for screening for respiratory tuberculosis: Secondary | ICD-10-CM

## 2017-11-24 DIAGNOSIS — Z23 Encounter for immunization: Secondary | ICD-10-CM | POA: Diagnosis not present

## 2017-11-24 DIAGNOSIS — Z0289 Encounter for other administrative examinations: Secondary | ICD-10-CM | POA: Diagnosis not present

## 2017-11-24 NOTE — Progress Notes (Signed)
Name: Misty Archer   MRN: [id number]    DOB: [birth year]   Date:11/24/2017       Progress Note  Subjective  Chief Complaint  Chief Complaint  Patient presents with  . Employment Physical    needs TB and TDAP    Patient is a 37 year old female who presents for a employment physical exam. The patient reports the following problems: none. Health maintenance has been reviewedtdap given.   No problem-specific Assessment & Plan notes found for this encounter.   Past Medical History:  Diagnosis Date  . Asthma     Past Surgical History:  Procedure Laterality Date  . TONSILLECTOMY    . TUBAL LIGATION      Family History  Problem Relation Age of Onset  . Diabetes Mother   . Hypertension Mother   . Diabetes Father   . Hypertension Father     Social History   Socioeconomic History  . Marital status: Single    Spouse name: Not on file  . Number of children: Not on file  . Years of education: Not on file  . Highest education level: Not on file  Occupational History  . Not on file  Social Needs  . Financial resource strain: Not on file  . Food insecurity:    Worry: Not on file    Inability: Not on file  . Transportation needs:    Medical: Not on file    Non-medical: Not on file  Tobacco Use  . Smoking status: Never Smoker  . Smokeless tobacco: Never Used  Substance and Sexual Activity  . Alcohol use: No    Alcohol/week: 0.0 oz  . Drug use: No  . Sexual activity: Yes  Lifestyle  . Physical activity:    Days per week: Not on file    Minutes per session: Not on file  . Stress: Not on file  Relationships  . Social connections:    Talks on phone: Not on file    Gets together: Not on file    Attends religious service: Not on file    Active member of club or organization: Not on file    Attends meetings of clubs or organizations: Not on file    Relationship status: Not on file  . Intimate partner violence:    Fear of current or ex partner: Not on file   Emotionally abused: Not on file    Physically abused: Not on file    Forced sexual activity: Not on file  Other Topics Concern  . Not on file  Social History Narrative   ** Merged History Encounter **        Allergies  Allergen Reactions  . Codeine Rash  . Latex Rash    Outpatient Medications Prior to Visit  Medication Sig Dispense Refill  . acetaminophen (TYLENOL) 500 MG tablet Take 500 mg by mouth every 6 (six) hours as needed. For pain or headache     . albuterol (PROVENTIL HFA;VENTOLIN HFA) 108 (90 Base) MCG/ACT inhaler Inhale 2 puffs into the lungs every 6 (six) hours as needed for wheezing or shortness of breath. 1 Inhaler 11  . albuterol (PROVENTIL) (2.5 MG/3ML) 0.083% nebulizer solution Take 3 mLs (2.5 mg total) by nebulization every 6 (six) hours as needed for wheezing or shortness of breath. 75 mL 12  . ipratropium-albuterol (DUONEB) 0.5-2.5 (3) MG/3ML SOLN Take 3 mLs by nebulization every 6 (six) hours as needed. 360 mL 0  . predniSONE (DELTASONE) 10 MG tablet Taper  6,6,6,5,5,5,4,4,3,3,2,2,1,1 60 tablet 0   No facility-administered medications prior to visit.     Review of Systems  Constitutional: Negative for chills, fever, malaise/fatigue and weight loss.  HENT: Negative for ear discharge, ear pain and sore throat.   Eyes: Negative for blurred vision.  Respiratory: Negative for cough, sputum production, shortness of breath and wheezing.   Cardiovascular: Negative for chest pain, palpitations and leg swelling.  Gastrointestinal: Negative for abdominal pain, blood in stool, constipation, diarrhea, heartburn, melena and nausea.  Genitourinary: Negative for dysuria, frequency, hematuria and urgency.  Musculoskeletal: Negative for back pain, joint pain, myalgias and neck pain.  Skin: Negative for rash.  Neurological: Negative for dizziness, tingling, sensory change, focal weakness and headaches.  Endo/Heme/Allergies: Negative for environmental allergies and polydipsia.  Does not bruise/bleed easily.  Psychiatric/Behavioral: Negative for depression and suicidal ideas. The patient is not nervous/anxious and does not have insomnia.      Objective  Vitals:   11/24/17 0822  BP: 124/70  Pulse: 68  Weight: 152 lb (68.9 kg)  Height: 5\' 7"  (1.702 m)    Physical Exam  Constitutional: No distress.  HENT:  Head: Normocephalic and atraumatic.  Right Ear: External ear normal.  Left Ear: External ear normal.  Nose: Nose normal.  Mouth/Throat: Oropharynx is clear and moist.  Eyes: Pupils are equal, round, and reactive to light. Conjunctivae and EOM are normal. Right eye exhibits no discharge. Left eye exhibits no discharge.  Neck: Normal range of motion. Neck supple. No JVD present. No thyromegaly present.  Cardiovascular: Normal rate, regular rhythm, normal heart sounds and intact distal pulses. Exam reveals no gallop and no friction rub.  No murmur heard. Pulmonary/Chest: Effort normal and breath sounds normal.  Abdominal: Soft. Bowel sounds are normal. She exhibits no mass. There is no tenderness. There is no guarding.  Musculoskeletal: Normal range of motion. She exhibits no edema.  Lymphadenopathy:    She has no cervical adenopathy.  Neurological: She is alert. She has normal reflexes.  Skin: Skin is warm and dry. She is not diaphoretic.  Nursing note and vitals reviewed.     Assessment & Plan  Problem List Items Addressed This Visit    None    Visit Diagnoses    Encounter for physical examination related to employment    -  Primary   Need statement physically able to do part time employment    Patient is physically able to perform duties of part-time employment.  No orders of the defined types were placed in this encounter.     Dr. Hayden Rasmusseneanna Jones [facility]  11/24/17

## 2017-12-06 ENCOUNTER — Other Ambulatory Visit: Payer: Self-pay

## 2018-04-23 ENCOUNTER — Ambulatory Visit (INDEPENDENT_AMBULATORY_CARE_PROVIDER_SITE_OTHER): Payer: BC Managed Care – PPO

## 2018-04-23 DIAGNOSIS — Z23 Encounter for immunization: Secondary | ICD-10-CM

## 2018-06-25 ENCOUNTER — Other Ambulatory Visit: Payer: Self-pay

## 2018-06-25 DIAGNOSIS — J0100 Acute maxillary sinusitis, unspecified: Secondary | ICD-10-CM

## 2018-06-25 DIAGNOSIS — J4520 Mild intermittent asthma, uncomplicated: Secondary | ICD-10-CM

## 2018-06-25 DIAGNOSIS — J219 Acute bronchiolitis, unspecified: Secondary | ICD-10-CM

## 2018-06-25 MED ORDER — ALBUTEROL SULFATE HFA 108 (90 BASE) MCG/ACT IN AERS: 2.0000 | INHALATION_SPRAY | Freq: Four times a day (QID) | RESPIRATORY_TRACT | 11 refills | Status: DC | PRN

## 2018-11-26 ENCOUNTER — Other Ambulatory Visit (HOSPITAL_COMMUNITY)
Admission: RE | Admit: 2018-11-26 | Discharge: 2018-11-26 | Disposition: A | Discharge status: Home / Self Care | Payer: BC Managed Care – PPO | Source: Ambulatory Visit | Attending: Family Medicine | Admitting: Family Medicine

## 2018-11-26 ENCOUNTER — Ambulatory Visit (INDEPENDENT_AMBULATORY_CARE_PROVIDER_SITE_OTHER): Payer: BC Managed Care – PPO | Admitting: Family Medicine

## 2018-11-26 ENCOUNTER — Other Ambulatory Visit: Payer: Self-pay

## 2018-11-26 ENCOUNTER — Encounter: Payer: Self-pay | Admitting: Family Medicine

## 2018-11-26 VITALS — BP 124/80 | HR 76 | Ht 67.0 in | Wt 156.0 lb

## 2018-11-26 DIAGNOSIS — N920 Excessive and frequent menstruation with regular cycle: Secondary | ICD-10-CM

## 2018-11-26 DIAGNOSIS — Z124 Encounter for screening for malignant neoplasm of cervix: Secondary | ICD-10-CM

## 2018-11-26 DIAGNOSIS — Z01419 Encounter for gynecological examination (general) (routine) without abnormal findings: Secondary | ICD-10-CM | POA: Diagnosis not present

## 2018-11-26 DIAGNOSIS — Z0000 Encounter for general adult medical examination without abnormal findings: Secondary | ICD-10-CM | POA: Diagnosis present

## 2018-11-26 DIAGNOSIS — Z1211 Encounter for screening for malignant neoplasm of colon: Secondary | ICD-10-CM | POA: Diagnosis not present

## 2018-11-26 LAB — HEMOCCULT GUIAC POC 1CARD (OFFICE): Fecal Occult Blood, POC: NEGATIVE

## 2018-11-26 NOTE — Patient Instructions (Signed)
Mediterranean Diet  A Mediterranean diet refers to food and lifestyle choices that are based on the traditions of countries located on the [place]. This way of eating has been shown to help prevent certain conditions and improve outcomes for people who have chronic diseases, like kidney disease and heart disease.  What are tips for following this plan?  Lifestyle   Cook and eat meals together with your family, when possible.   Drink enough fluid to keep your urine clear or pale yellow.   Be physically active every day. This includes:  ? Aerobic exercise like running or swimming.  ? Leisure activities like gardening, walking, or housework.   Get 7-8 hours of sleep each night.   If recommended by your health care provider, drink red wine in moderation. This means 1 glass a day for nonpregnant women and 2 glasses a day for men. A glass of wine equals 5 oz (150 mL).  Reading food labels     Check the serving size of packaged foods. For foods such as rice and pasta, the serving size refers to the amount of cooked product, not dry.   Check the total fat in packaged foods. Avoid foods that have saturated fat or trans fats.   Check the ingredients list for added sugars, such as corn syrup.  Shopping   At the grocery store, buy most of your food from the areas near the walls of the store. This includes:  ? Fresh fruits and vegetables (produce).  ? Grains, beans, nuts, and seeds. Some of these may be available in unpackaged forms or large amounts (in bulk).  ? Fresh seafood.  ? Poultry and eggs.  ? Low-fat dairy products.   Buy whole ingredients instead of prepackaged foods.   Buy fresh fruits and vegetables in-season from local farmers markets.   Buy frozen fruits and vegetables in resealable bags.   If you do not have access to quality fresh seafood, buy precooked frozen shrimp or canned fish, such as tuna, salmon, or sardines.   Buy small amounts of raw or cooked vegetables, salads, or olives from  the deli or salad bar at your store.   Stock your pantry so you always have certain foods on hand, such as olive oil, canned tuna, canned tomatoes, rice, pasta, and beans.  Cooking   Cook foods with extra-virgin olive oil instead of using butter or other vegetable oils.   Have meat as a side dish, and have vegetables or grains as your main dish. This means having meat in small portions or adding small amounts of meat to foods like pasta or stew.   Use beans or vegetables instead of meat in common dishes like chili or lasagna.   Experiment with different cooking methods. Try roasting or broiling vegetables instead of steaming or sauteing them.   Add frozen vegetables to soups, stews, pasta, or rice.   Add nuts or seeds for added healthy fat at each meal. You can add these to yogurt, salads, or vegetable dishes.   Marinate fish or vegetables using olive oil, lemon juice, garlic, and fresh herbs.  Meal planning     Plan to eat 1 vegetarian meal one day each week. Try to work up to 2 vegetarian meals, if possible.   Eat seafood 2 or more times a week.   Have healthy snacks readily available, such as:  ? Vegetable sticks with hummus.  ? Greek yogurt.  ? Fruit and nut trail mix.   Eat balanced   meals throughout the week. This includes:  ? Fruit: 2-3 servings a day  ? Vegetables: 4-5 servings a day  ? Low-fat dairy: 2 servings a day  ? Fish, poultry, or lean meat: 1 serving a day  ? Beans and legumes: 2 or more servings a week  ? Nuts and seeds: 1-2 servings a day  ? Whole grains: 6-8 servings a day  ? Extra-virgin olive oil: 3-4 servings a day   Limit red meat and sweets to only a few servings a month  What are my food choices?   Mediterranean diet  ? Recommended  ? Grains: Whole-grain pasta. Brown rice. Bulgar wheat. Polenta. Couscous. Whole-wheat bread. Oatmeal. Quinoa.  ? Vegetables: Artichokes. Beets. Broccoli. Cabbage. Carrots. Eggplant. Green beans. Chard. Kale. Spinach. Onions. Leeks. Peas. Squash.  Tomatoes. Peppers. Radishes.  ? Fruits: Apples. Apricots. Avocado. Berries. Bananas. Cherries. Dates. Figs. Grapes. Lemons. Melon. Oranges. Peaches. Plums. Pomegranate.  ? Meats and other protein foods: Beans. Almonds. Sunflower seeds. Pine nuts. Peanuts. Cod. Salmon. Scallops. Shrimp. Tuna. Tilapia. Clams. Oysters. Eggs.  ? Dairy: Low-fat milk. Cheese. Greek yogurt.  ? Beverages: Water. Red wine. Herbal tea.  ? Fats and oils: Extra virgin olive oil. Avocado oil. Grape seed oil.  ? Sweets and desserts: Greek yogurt with honey. Baked apples. Poached pears. Trail mix.  ? Seasoning and other foods: Basil. Cilantro. Coriander. Cumin. Mint. Parsley. Sage. Rosemary. Tarragon. Garlic. Oregano. Thyme. Pepper. Balsalmic vinegar. Tahini. Hummus. Tomato sauce. Olives. Mushrooms.  ? Limit these  ? Grains: Prepackaged pasta or rice dishes. Prepackaged cereal with added sugar.  ? Vegetables: Deep fried potatoes (french fries).  ? Fruits: Fruit canned in syrup.  ? Meats and other protein foods: Beef. Pork. Lamb. Poultry with skin. Hot dogs. Bacon.  ? Dairy: Ice cream. Sour cream. Whole milk.  ? Beverages: Juice. Sugar-sweetened soft drinks. Beer. Liquor and spirits.  ? Fats and oils: Butter. Canola oil. Vegetable oil. Beef fat (tallow). Lard.  ? Sweets and desserts: Cookies. Cakes. Pies. Candy.  ? Seasoning and other foods: Mayonnaise. Premade sauces and marinades.  ? The items listed may not be a complete list. Talk with your dietitian about what dietary choices are right for you.  Summary   The Mediterranean diet includes both food and lifestyle choices.   Eat a variety of fresh fruits and vegetables, beans, nuts, seeds, and whole grains.   Limit the amount of red meat and sweets that you eat.   Talk with your health care provider about whether it is safe for you to drink red wine in moderation. This means 1 glass a day for nonpregnant women and 2 glasses a day for men. A glass of wine equals 5 oz (150 mL).  This information  is not intended to replace advice given to you by your health care provider. Make sure you discuss any questions you have with your health care provider.  Document Released: 01/21/2016 Document Revised: 02/23/2016 Document Reviewed: 01/21/2016  Elsevier Interactive Patient Education  2019 [employer]

## 2018-11-26 NOTE — Progress Notes (Signed)
Date:  11/26/2018   Name:  Misty Archer   DOB:  1981/05/18   MRN:  [id number]   Chief Complaint: Annual Exam (with pap)  Patient is a 38 year old female who presents for a comprehensive physical exam. The patient reports the following problems: increase bleeding periods/menorrhagia/. Health maintenance has been reviewed pap due.   Review of Systems  Constitutional: Negative for chills and fever.  HENT: Negative for drooling, ear discharge, ear pain, postnasal drip, sinus pressure and sore throat.   Respiratory: Negative for cough, choking, shortness of breath and wheezing.   Cardiovascular: Negative for chest pain, palpitations and leg swelling.  Gastrointestinal: Negative for abdominal pain, blood in stool, constipation, diarrhea and nausea.  Endocrine: Negative for polydipsia.  Genitourinary: Positive for menstrual problem. Negative for dysuria, frequency, genital sores, hematuria, urgency, vaginal bleeding, vaginal discharge and vaginal pain.  Musculoskeletal: Negative for back pain, myalgias and neck pain.  Skin: Negative for rash.  Allergic/Immunologic: Negative for environmental allergies.  Neurological: Negative for dizziness and headaches.  Hematological: Does not bruise/bleed easily.  Psychiatric/Behavioral: Negative for suicidal ideas. The patient is not nervous/anxious.     There are no active problems to display for this patient.   Allergies  Allergen Reactions  . Codeine Rash  . Latex Rash    Past Surgical History:  Procedure Laterality Date  . TONSILLECTOMY    . TUBAL LIGATION      Social History   Tobacco Use  . Smoking status: Never Smoker  . Smokeless tobacco: Never Used  Substance Use Topics  . Alcohol use: No    Alcohol/week: 0.0 standard drinks  . Drug use: No     Medication list has been reviewed and updated.  Current Meds  Medication Sig  . acetaminophen (TYLENOL) 500 MG tablet Take 500 mg by mouth every 6 (six) hours as needed. For  pain or headache   . albuterol (PROVENTIL HFA;VENTOLIN HFA) 108 (90 Base) MCG/ACT inhaler Inhale 2 puffs into the lungs every 6 (six) hours as needed for wheezing or shortness of breath.  Marland Kitchen. albuterol (PROVENTIL) (2.5 MG/3ML) 0.083% nebulizer solution Take 3 mLs (2.5 mg total) by nebulization every 6 (six) hours as needed for wheezing or shortness of breath.  Marland Kitchen. ipratropium-albuterol (DUONEB) 0.5-2.5 (3) MG/3ML SOLN Take 3 mLs by nebulization every 6 (six) hours as needed.    PHQ 2/9 Scores 11/24/2017 08/14/2017 08/14/2017 08/17/2015  PHQ - 2 Score 0 0 0 0  PHQ- 9 Score 1 2 - -    BP Readings from Last 3 Encounters:  11/26/18 124/80  11/24/17 124/70  10/20/17 120/62    Physical Exam Vitals signs and nursing note reviewed. Exam conducted with a chaperone present.  Constitutional:      Appearance: Normal appearance. She is well-developed, well-groomed and normal weight.  HENT:     Head: Normocephalic.     Jaw: There is normal jaw occlusion.     Salivary Glands: Right salivary gland is not diffusely enlarged or tender. Left salivary gland is not diffusely enlarged or tender.     Right Ear: Hearing, tympanic membrane, ear canal and external ear normal.     Left Ear: Hearing, tympanic membrane, ear canal and external ear normal.     Nose: Nose normal.     Right Turbinates: Not enlarged or swollen.     Left Turbinates: Not enlarged or swollen.     Mouth/Throat:     Lips: Pink.     Mouth: Mucous membranes  are moist.     Dentition: Normal dentition.     Tongue: No lesions. Tongue does not deviate from midline.     Pharynx: Oropharynx is clear. Uvula midline.     Tonsils: No tonsillar exudate or tonsillar abscesses.  Eyes:     General: Lids are normal. Lids are everted, no foreign bodies appreciated. Vision grossly intact. Gaze aligned appropriately. No visual field deficit or scleral icterus.    Extraocular Movements: Extraocular movements intact.     Right eye: Normal extraocular motion.      Left eye: Normal extraocular motion.     Conjunctiva/sclera: Conjunctivae normal.     Right eye: Right conjunctiva is not injected.     Left eye: Left conjunctiva is not injected.     Pupils: Pupils are equal, round, and reactive to light.  Neck:     Musculoskeletal: Full passive range of motion without pain, normal range of motion and neck supple.     Thyroid: No thyroid mass, thyromegaly or thyroid tenderness.     Vascular: Normal carotid pulses. No carotid bruit, hepatojugular reflux or JVD.     Trachea: Trachea and phonation normal. No tracheal deviation.  Cardiovascular:     Rate and Rhythm: Normal rate and regular rhythm.  No extrasystoles are present.    Chest Wall: PMI is not displaced. No thrill.     Pulses: Normal pulses.          Carotid pulses are 2+ on the right side and 2+ on the left side.      Radial pulses are 2+ on the right side and 2+ on the left side.       Femoral pulses are 2+ on the right side and 2+ on the left side.      Popliteal pulses are 2+ on the right side and 2+ on the left side.       Dorsalis pedis pulses are 2+ on the right side and 2+ on the left side.       Posterior tibial pulses are 2+ on the right side and 2+ on the left side.     Heart sounds: Normal heart sounds, S1 normal and S2 normal. No murmur. No systolic murmur. No diastolic murmur. No friction rub. No gallop. No S3 or S4 sounds.   Pulmonary:     Effort: Pulmonary effort is normal. No respiratory distress.     Breath sounds: Normal breath sounds and air entry. No decreased air movement. No decreased breath sounds, wheezing, rhonchi or rales.  Chest:     Chest wall: No mass or edema. There is no dullness to percussion.     Breasts: Breasts are symmetrical.        Right: Normal. No swelling, bleeding, inverted nipple, mass, nipple discharge, skin change or tenderness.        Left: Normal. No swelling, bleeding, inverted nipple, mass, nipple discharge, skin change or tenderness.  Abdominal:      General: Abdomen is flat. Bowel sounds are normal.     Palpations: Abdomen is soft. There is no hepatomegaly, splenomegaly or mass.     Tenderness: There is no abdominal tenderness. There is no guarding or rebound.     Hernia: No hernia is present. There is no hernia in the left inguinal area or right inguinal area.  Genitourinary:    General: Normal vulva.     Exam position: Lithotomy position.     Pubic Area: No rash.      Labia:  Right: No rash, tenderness, lesion or injury.        Left: No rash, tenderness, lesion or injury.      Urethra: No prolapse, urethral pain or urethral swelling.     Vagina: Normal.     Cervix: Friability and eversion present.     Uterus: Normal.      Adnexa: Right adnexa normal and left adnexa normal.       Right: No mass, tenderness or fullness.         Left: No mass, tenderness or fullness.       Rectum: Normal. Guaiac result negative. No mass or external hemorrhoid.     Comments: retoflexexed Musculoskeletal: Normal range of motion.        General: No swelling, tenderness, deformity or signs of injury.     Cervical back: Normal.     Thoracic back: Normal.     Lumbar back: Normal.     Right lower leg: No edema.     Left lower leg: No edema.  Feet:     Right foot:     Skin integrity: Skin integrity normal.     Left foot:     Skin integrity: Skin integrity normal.  Lymphadenopathy:     Head:     Right side of head: No submental, submandibular or tonsillar adenopathy.     Left side of head: No submental, submandibular or tonsillar adenopathy.     Cervical: No cervical adenopathy.     Right cervical: No superficial, deep or posterior cervical adenopathy.    Left cervical: No superficial, deep or posterior cervical adenopathy.     Upper Body:     Right upper body: No supraclavicular, axillary or pectoral adenopathy.     Left upper body: No supraclavicular, axillary or pectoral adenopathy.     Lower Body: No right inguinal adenopathy.   Skin:    General: Skin is warm.     Capillary Refill: Capillary refill takes less than 2 seconds.     Coloration: Skin is not jaundiced or pale.     Findings: No bruising, erythema, lesion or rash.  Neurological:     General: No focal deficit present.     Mental Status: She is alert and oriented to person, place, and time.     Cranial Nerves: Cranial nerves are intact. No cranial nerve deficit.     Sensory: Sensation is intact.     Motor: Motor function is intact.     Gait: Gait is intact.     Deep Tendon Reflexes: Reflexes are normal and symmetric. Reflexes normal.     Reflex Scores:      Tricep reflexes are 2+ on the right side and 2+ on the left side.      Bicep reflexes are 2+ on the right side and 2+ on the left side.      Brachioradialis reflexes are 2+ on the right side and 2+ on the left side.      Patellar reflexes are 2+ on the right side and 2+ on the left side.      Achilles reflexes are 2+ on the right side and 2+ on the left side. Psychiatric:        Attention and Perception: Attention and perception normal.        Mood and Affect: Mood and affect normal. Mood is not anxious or depressed.        Speech: Speech normal.        Behavior: Behavior normal. Behavior  is cooperative.        Cognition and Memory: Cognition normal.     Wt Readings from Last 3 Encounters:  11/26/18 156 lb (70.8 kg)  11/24/17 152 lb (68.9 kg)  10/20/17 152 lb (68.9 kg)    BP 124/80   Pulse 76   Ht 5\' 7"  (1.702 m)   Wt 156 lb (70.8 kg)   LMP 11/21/2018 Comment: finished yesterday  Breastfeeding No Comment: not had a baby in 6 years  BMI 24.43 kg/m  Assessment and Plan: 1. Annual physical exam No subjective/objective concerns noted during the history and physical.  Patient's chart was reviewed for previous visits/labs, imaging, and previous visits.  Patient will undergo a renal function panel lipid panel CBC as well as cytology for rule out of cervical cancer.Misty Archer is a 38 y.o.  female who presents today for her Complete Annual Exam. She feels well. She reports exercising . She reports she is sleeping well. Immunizations are reviewed and recommendations provided.   Age appropriate screening tests are discussed. Counseling given for risk factor reduction interventions. - Renal Function Panel - Lipid panel - CBC with Differential/Platelet - Cytology - PAP - POCT Occult Blood Stool  2. Encounter for annual routine gynecological examination Patient underwent exam of cervix for cancer as well as Pap smear with HPV and manual exam to determine abnormalities.  Exam in total was normal.  3. Menorrhagia with regular cycle Patient relates that she is having increased bleeding with clots but no irregularity of her periods.  Will check a CBC to determine if there is any anemia or a platelet abnormality. - CBC with Differential/Platelet  4. Cervical cancer screening When HPV with cytology to rule out cervical cancer. - Cytology - PAP  5. Colon cancer screening Negative was noted on the guaiac screening. - POCT Occult Blood Stool

## 2018-11-27 LAB — RENAL FUNCTION PANEL
Albumin: 4.5 g/dL (ref 3.8–4.8)
BUN/Creatinine Ratio: 15 (ref 9–23)
BUN: 12 mg/dL (ref 6–20)
CO2: 20 mmol/L (ref 20–29)
Calcium: 9.4 mg/dL (ref 8.7–10.2)
Chloride: 103 mmol/L (ref 96–106)
Creatinine, Ser: 0.81 mg/dL (ref 0.57–1.00)
GFR calc Af Amer: 107 mL/min/1.73
GFR calc non Af Amer: 92 mL/min/1.73
Glucose: 73 mg/dL (ref 65–99)
Phosphorus: 3.3 mg/dL (ref 3.0–4.3)
Potassium: 4.5 mmol/L (ref 3.5–5.2)
Sodium: 139 mmol/L (ref 134–144)

## 2018-11-27 LAB — CBC WITH DIFFERENTIAL/PLATELET
Basophils Absolute: 0 x10E3/uL (ref 0.0–0.2)
Basos: 1 %
EOS (ABSOLUTE): 0.1 x10E3/uL (ref 0.0–0.4)
Eos: 2 %
Hematocrit: 38.9 % (ref 34.0–46.6)
Hemoglobin: 12.7 g/dL (ref 11.1–15.9)
Immature Grans (Abs): 0 x10E3/uL (ref 0.0–0.1)
Immature Granulocytes: 0 %
Lymphocytes Absolute: 2.5 x10E3/uL (ref 0.7–3.1)
Lymphs: 39 %
MCH: 30.3 pg (ref 26.6–33.0)
MCHC: 32.6 g/dL (ref 31.5–35.7)
MCV: 93 fL (ref 79–97)
Monocytes Absolute: 0.4 x10E3/uL (ref 0.1–0.9)
Monocytes: 6 %
Neutrophils Absolute: 3.5 x10E3/uL (ref 1.4–7.0)
Neutrophils: 52 %
Platelets: 206 x10E3/uL (ref 150–450)
RBC: 4.19 x10E6/uL (ref 3.77–5.28)
RDW: 11.1 % — ABNORMAL LOW (ref 11.7–15.4)
WBC: 6.6 x10E3/uL (ref 3.4–10.8)

## 2018-11-27 LAB — LIPID PANEL
Chol/HDL Ratio: 2.9 ratio (ref 0.0–4.4)
Cholesterol, Total: 165 mg/dL (ref 100–199)
HDL: 57 mg/dL
LDL Calculated: 90 mg/dL (ref 0–99)
Triglycerides: 88 mg/dL (ref 0–149)
VLDL Cholesterol Cal: 18 mg/dL (ref 5–40)

## 2018-11-29 LAB — CYTOLOGY - PAP: Diagnosis: NEGATIVE

## 2018-11-30 ENCOUNTER — Other Ambulatory Visit: Payer: Self-pay

## 2018-11-30 DIAGNOSIS — A599 Trichomoniasis, unspecified: Secondary | ICD-10-CM

## 2018-11-30 MED ORDER — METRONIDAZOLE 500 MG PO TABS: 2000.0000 mg | ORAL_TABLET | Freq: Every day | ORAL | 0 refills | Status: DC

## 2018-11-30 NOTE — Progress Notes (Unsigned)
Sent in flagyl for Tx

## 2019-01-14 ENCOUNTER — Telehealth: Payer: Self-pay | Admitting: Gastroenterology

## 2019-02-15 NOTE — Telephone Encounter (Signed)
error 

## 2019-02-26 ENCOUNTER — Ambulatory Visit: Payer: BC Managed Care – PPO | Admitting: Family Medicine

## 2019-03-18 ENCOUNTER — Other Ambulatory Visit: Payer: Self-pay

## 2019-03-18 ENCOUNTER — Ambulatory Visit (INDEPENDENT_AMBULATORY_CARE_PROVIDER_SITE_OTHER): Payer: BC Managed Care – PPO

## 2019-03-18 DIAGNOSIS — Z23 Encounter for immunization: Secondary | ICD-10-CM | POA: Diagnosis not present

## 2019-04-16 ENCOUNTER — Other Ambulatory Visit: Payer: Self-pay

## 2019-04-16 ENCOUNTER — Ambulatory Visit: Payer: BC Managed Care – PPO | Admitting: Family Medicine

## 2019-04-16 DIAGNOSIS — Z20822 Contact with and (suspected) exposure to covid-19: Secondary | ICD-10-CM

## 2019-04-18 LAB — NOVEL CORONAVIRUS, NAA: SARS-CoV-2, NAA: NOT DETECTED

## 2019-05-29 ENCOUNTER — Ambulatory Visit: Payer: BC Managed Care – PPO | Admitting: Family Medicine

## 2019-05-29 ENCOUNTER — Other Ambulatory Visit: Payer: Self-pay

## 2019-05-29 ENCOUNTER — Encounter: Payer: Self-pay | Admitting: Family Medicine

## 2019-05-29 VITALS — BP 118/72 | HR 71 | Wt 167.0 lb

## 2019-05-29 DIAGNOSIS — G43709 Chronic migraine without aura, not intractable, without status migrainosus: Secondary | ICD-10-CM | POA: Diagnosis not present

## 2019-05-29 MED ORDER — SUMATRIPTAN SUCCINATE 25 MG PO TABS: 25.0000 mg | ORAL_TABLET | ORAL | 3 refills | Status: DC | PRN

## 2019-05-29 NOTE — Patient Instructions (Signed)

## 2019-05-29 NOTE — Progress Notes (Signed)
Date:  05/29/2019   Name:  Misty Archer   DOB:  [birth year]   MRN:  [id number]   Chief Complaint: Headache (has one before menstrual period and after period ends, but does also experience headaches outside of cycle- sometimes lasting 3 days straight. 8/10 on pain scale- excedrin migraine helps)  Headache  This is a recurrent problem. The current episode started more than 1 year ago. The problem occurs intermittently. The problem has been gradually worsening (increasing frequency). The pain is located in the right unilateral, retro-orbital and temporal region. The pain radiates to the right neck. The quality of the pain is described as aching and throbbing. The pain is at a severity of 9/10. Associated symptoms include blurred vision, ear pain, muscle aches, nausea, phonophobia, photophobia and a visual change. Pertinent negatives include no abdominal pain, back pain, coughing, dizziness, drainage, eye pain, eye redness, eye watering, facial sweating, fever, hearing loss, loss of balance, neck pain, numbness, rhinorrhea, scalp tenderness, seizures, sinus pressure, sore throat, swollen glands, tingling, tinnitus, vomiting or weakness. The symptoms are aggravated by bright light, noise and caffeine withdrawal (LED lighting). She has tried acetaminophen, Excedrin and NSAIDs for the symptoms. The treatment provided mild (excedrin) relief. Her past medical history is significant for migraines in the family. There is no history of cluster headaches, hypertension, recent head traumas or sinus disease.    Lab Results  Component Value Date   CREATININE 0.81 11/26/2018   BUN 12 11/26/2018   NA 139 11/26/2018   K 4.5 11/26/2018   CL 103 11/26/2018   CO2 20 11/26/2018   Lab Results  Component Value Date   CHOL 165 11/26/2018   HDL 57 11/26/2018   LDLCALC 90 11/26/2018   TRIG 88 11/26/2018   CHOLHDL 2.9 11/26/2018   No results found for: TSH No results found for: HGBA1C   Review of Systems    Constitutional: Negative.  Negative for chills, fatigue, fever and unexpected weight change.  HENT: Positive for ear pain. Negative for congestion, ear discharge, hearing loss, rhinorrhea, sinus pressure, sneezing, sore throat and tinnitus.   Eyes: Positive for blurred vision and photophobia. Negative for pain, discharge, redness and itching.  Respiratory: Negative for cough, shortness of breath, wheezing and stridor.   Cardiovascular: Negative for palpitations and leg swelling.  Gastrointestinal: Positive for nausea. Negative for abdominal pain, blood in stool, constipation, diarrhea and vomiting.  Endocrine: Negative for cold intolerance, heat intolerance, polydipsia, polyphagia and polyuria.  Genitourinary: Negative for dysuria, flank pain, frequency, hematuria, menstrual problem, pelvic pain, urgency, vaginal bleeding and vaginal discharge.  Musculoskeletal: Negative for arthralgias, back pain, myalgias and neck pain.  Skin: Negative for rash.  Allergic/Immunologic: Negative for environmental allergies and food allergies.  Neurological: Positive for headaches. Negative for dizziness, tingling, seizures, weakness, light-headedness, numbness and loss of balance.  Hematological: Negative for adenopathy. Does not bruise/bleed easily.  Psychiatric/Behavioral: Negative for dysphoric mood. The patient is not nervous/anxious.     There are no problems to display for this patient.   Allergies  Allergen Reactions  . Codeine Rash  . Latex Rash    Past Surgical History:  Procedure Laterality Date  . TONSILLECTOMY    . TUBAL LIGATION      Social History   Tobacco Use  . Smoking status: Never Smoker  . Smokeless tobacco: Never Used  Substance Use Topics  . Alcohol use: No    Alcohol/week: 0.0 standard drinks  . Drug use: No     Medication  list has been reviewed and updated.  Current Meds  Medication Sig  . acetaminophen (TYLENOL) 500 MG tablet Take 500 mg by mouth every 6 (six)  hours as needed. For pain or headache   . albuterol (PROVENTIL HFA;VENTOLIN HFA) 108 (90 Base) MCG/ACT inhaler Inhale 2 puffs into the lungs every 6 (six) hours as needed for wheezing or shortness of breath.  Marland Kitchen albuterol (PROVENTIL) (2.5 MG/3ML) 0.083% nebulizer solution Take 3 mLs (2.5 mg total) by nebulization every 6 (six) hours as needed for wheezing or shortness of breath.  Marland Kitchen ipratropium-albuterol (DUONEB) 0.5-2.5 (3) MG/3ML SOLN Take 3 mLs by nebulization every 6 (six) hours as needed.    PHQ 2/9 Scores 05/29/2019 11/26/2018 11/24/2017 08/14/2017  PHQ - 2 Score 0 0 0 0  PHQ- 9 Score 0 0 1 2    BP Readings from Last 3 Encounters:  05/29/19 118/72  11/26/18 124/80  11/24/17 124/70    Physical Exam Vitals and nursing note reviewed.  Constitutional:      Appearance: She is well-developed.  HENT:     Head: Normocephalic and atraumatic.     Jaw: There is normal jaw occlusion.     Right Ear: Hearing, tympanic membrane, ear canal and external ear normal.     Left Ear: Hearing, tympanic membrane, ear canal and external ear normal.     Nose: Nose normal.     Mouth/Throat:     Lips: Pink.     Mouth: Mucous membranes are moist.     Palate: No mass.     Pharynx: Oropharynx is clear. Uvula midline.  Eyes:     General: Lids are normal. Vision grossly intact. Gaze aligned appropriately. No scleral icterus.       Left eye: No foreign body or hordeolum.     Extraocular Movements: Extraocular movements intact.     Right eye: No nystagmus.     Left eye: No nystagmus.     Conjunctiva/sclera: Conjunctivae normal.     Right eye: Right conjunctiva is not injected.     Left eye: Left conjunctiva is not injected.     Pupils: Pupils are equal, round, and reactive to light.     Funduscopic exam:    Right eye: No papilledema. Red reflex present.        Left eye: No papilledema. Red reflex present. Neck:     Thyroid: No thyroid mass, thyromegaly or thyroid tenderness.     Vascular: Normal carotid  pulses. No carotid bruit, hepatojugular reflux or JVD.     Trachea: No tracheal deviation.  Cardiovascular:     Rate and Rhythm: Normal rate and regular rhythm.     Pulses: Normal pulses.     Heart sounds: Normal heart sounds, S1 normal and S2 normal. No murmur. No systolic murmur. No diastolic murmur. No friction rub. No gallop. No S3 or S4 sounds.   Pulmonary:     Effort: Pulmonary effort is normal. No respiratory distress.     Breath sounds: Normal breath sounds. No decreased breath sounds, wheezing, rhonchi or rales.  Abdominal:     General: Bowel sounds are normal.     Palpations: Abdomen is soft. There is no hepatomegaly, splenomegaly or mass.     Tenderness: There is no abdominal tenderness. There is no guarding or rebound.  Musculoskeletal:        General: No tenderness. Normal range of motion.     Cervical back: Full passive range of motion without pain, normal range of motion and  neck supple. No spinous process tenderness.  Lymphadenopathy:     Cervical: No cervical adenopathy.     Right cervical: No superficial, deep or posterior cervical adenopathy.    Left cervical: No superficial, deep or posterior cervical adenopathy.     Upper Body:     Right upper body: No supraclavicular adenopathy.     Left upper body: No supraclavicular adenopathy.  Skin:    General: Skin is warm.     Findings: No rash.  Neurological:     General: No focal deficit present.     Mental Status: She is alert and oriented to person, place, and time.     Cranial Nerves: Cranial nerves are intact. No cranial nerve deficit or facial asymmetry.     Sensory: Sensation is intact.     Motor: Motor function is intact. No weakness or tremor.     Deep Tendon Reflexes: Reflexes normal.     Reflex Scores:      Tricep reflexes are 2+ on the right side and 2+ on the left side.      Bicep reflexes are 2+ on the right side and 2+ on the left side.      Brachioradialis reflexes are 2+ on the right side and 2+ on the  left side.      Patellar reflexes are 2+ on the right side and 2+ on the left side.      Achilles reflexes are 2+ on the right side and 2+ on the left side. Psychiatric:        Mood and Affect: Mood is not anxious or depressed.     Wt Readings from Last 3 Encounters:  05/29/19 167 lb (75.8 kg)  11/26/18 156 lb (70.8 kg)  11/24/17 152 lb (68.9 kg)    BP 118/72   Pulse 71   Wt 167 lb (75.8 kg)   LMP 05/20/2019 (Approximate)   SpO2 98%   BMI 26.16 kg/m   Assessment and Plan: 1. Chronic migraine without aura without status migrainosus, not intractable Has a family history and a personal history of migraine-like headaches which patient has been treating with Excedrin/acetaminophen/occasional NSAIDs.  This has not been the best of treatment and sometimes the headaches have had durations of hours.  We will initiate Imitrex 25 mg 1 and may be repeated times once in a 24 hours 2 hours later at the first onset of the headache.  Patient will return in 2 to 3 months and we will discuss if we need to use suppression or we need to go up on the dose of Imitrex. - SUMAtriptan (IMITREX) 25 MG tablet; Take 1 tablet (25 mg total) by mouth every 2 (two) hours as needed for migraine (not to exceed 2 a day). May repeat in 2 hours if headache persists or recurs.  Dispense: 10 tablet; Refill: 3

## 2019-07-09 ENCOUNTER — Ambulatory Visit (INDEPENDENT_AMBULATORY_CARE_PROVIDER_SITE_OTHER): Payer: BC Managed Care – PPO | Admitting: Family Medicine

## 2019-07-09 ENCOUNTER — Encounter: Payer: Self-pay | Admitting: Family Medicine

## 2019-07-09 VITALS — Temp 98.0°F | Wt 160.0 lb

## 2019-07-09 DIAGNOSIS — R05 Cough: Secondary | ICD-10-CM

## 2019-07-09 DIAGNOSIS — J0100 Acute maxillary sinusitis, unspecified: Secondary | ICD-10-CM

## 2019-07-09 DIAGNOSIS — R059 Cough, unspecified: Secondary | ICD-10-CM

## 2019-07-09 MED ORDER — AMOXICILLIN-POT CLAVULANATE 875-125 MG PO TABS: 1.0000 | ORAL_TABLET | Freq: Two times a day (BID) | ORAL | 0 refills | Status: DC

## 2019-07-09 MED ORDER — BENZONATATE 100 MG PO CAPS: 100.0000 mg | ORAL_CAPSULE | Freq: Two times a day (BID) | ORAL | 0 refills | Status: DC | PRN

## 2019-07-09 NOTE — Progress Notes (Signed)
Date:  07/09/2019   Name:  Misty Archer   DOB:  05/14/1981   MRN:  [id number]   Chief Complaint: Sinusitis (pressure on right side of face- taking mucinex x 4 days, no nausea or vomitting, clear production, no fever- mother, brother, father and daughter tested positive for covid, pt's covid neg )  I connected withthis patient, Misty Archer, by telephoneat the patient's home.  I verified that I am speaking with the correct person using two identifiers. This visit was conducted via telephone due to the Covid-19 outbreak from my office at [facility] in [place], [place]. I discussed the limitations, risks, security and privacy concerns of performing an evaluation and management service by telephone. I also discussed with the patient that there may be a patient responsible charge related to this service. The patient expressed understanding and agreed to proceed.  Sinusitis This is a new problem. The current episode started in the past 7 days (saturday). The problem is unchanged. There has been no fever. The pain is mild. Associated symptoms include congestion, coughing, headaches, a hoarse voice, shortness of breath and sneezing. Pertinent negatives include no chills, diaphoresis, ear pain, neck pain, sinus pressure, sore throat or swollen glands. (Dyspnea better on symbicort/teeth pain) Past treatments include oral decongestants (migraine medication/mucinex max). The treatment provided no relief.    Lab Results  Component Value Date   CREATININE 0.81 11/26/2018   BUN 12 11/26/2018   NA 139 11/26/2018   K 4.5 11/26/2018   CL 103 11/26/2018   CO2 20 11/26/2018   Lab Results  Component Value Date   CHOL 165 11/26/2018   HDL 57 11/26/2018   LDLCALC 90 11/26/2018   TRIG 88 11/26/2018   CHOLHDL 2.9 11/26/2018   No results found for: TSH No results found for: HGBA1C   Review of Systems  Constitutional: Negative.  Negative for chills, diaphoresis, fatigue, fever and unexpected  weight change.  HENT: Positive for congestion, hoarse voice and sneezing. Negative for ear discharge, ear pain, rhinorrhea, sinus pressure and sore throat.   Eyes: Negative for photophobia, pain, discharge, redness and itching.  Respiratory: Positive for cough and shortness of breath. Negative for wheezing and stridor.   Gastrointestinal: Negative for abdominal pain, blood in stool, constipation, diarrhea, nausea and vomiting.  Endocrine: Negative for cold intolerance, heat intolerance, polydipsia, polyphagia and polyuria.  Genitourinary: Negative for dysuria, flank pain, frequency, hematuria, menstrual problem, pelvic pain, urgency, vaginal bleeding and vaginal discharge.  Musculoskeletal: Negative for arthralgias, back pain, myalgias and neck pain.  Skin: Negative for rash.  Allergic/Immunologic: Negative for environmental allergies and food allergies.  Neurological: Positive for headaches. Negative for dizziness, weakness, light-headedness and numbness.  Hematological: Negative for adenopathy. Does not bruise/bleed easily.  Psychiatric/Behavioral: Negative for dysphoric mood. The patient is not nervous/anxious.     There are no problems to display for this patient.   Allergies  Allergen Reactions  . Codeine Rash  . Latex Rash    Past Surgical History:  Procedure Laterality Date  . TONSILLECTOMY    . TUBAL LIGATION      Social History   Tobacco Use  . Smoking status: Never Smoker  . Smokeless tobacco: Never Used  Substance Use Topics  . Alcohol use: No    Alcohol/week: 0.0 standard drinks  . Drug use: No     Medication list has been reviewed and updated.  Current Meds  Medication Sig  . acetaminophen (TYLENOL) 500 MG tablet Take 500 mg by mouth  every 6 (six) hours as needed. For pain or headache   . albuterol (PROVENTIL HFA;VENTOLIN HFA) 108 (90 Base) MCG/ACT inhaler Inhale 2 puffs into the lungs every 6 (six) hours as needed for wheezing or shortness of breath.  Marland Kitchen  albuterol (PROVENTIL) (2.5 MG/3ML) 0.083% nebulizer solution Take 3 mLs (2.5 mg total) by nebulization every 6 (six) hours as needed for wheezing or shortness of breath.  Marland Kitchen ipratropium-albuterol (DUONEB) 0.5-2.5 (3) MG/3ML SOLN Take 3 mLs by nebulization every 6 (six) hours as needed.  . SUMAtriptan (IMITREX) 25 MG tablet Take 1 tablet (25 mg total) by mouth every 2 (two) hours as needed for migraine (not to exceed 2 a day). May repeat in 2 hours if headache persists or recurs.    PHQ 2/9 Scores 07/09/2019 05/29/2019 11/26/2018 11/24/2017  PHQ - 2 Score 0 0 0 0  PHQ- 9 Score 0 0 0 1    BP Readings from Last 3 Encounters:  05/29/19 118/72  11/26/18 124/80  11/24/17 124/70    Physical Exam Nursing note reviewed.  HENT:     Nose:     Right Sinus: Maxillary sinus tenderness present.     Left Sinus: Maxillary sinus tenderness present.     Wt Readings from Last 3 Encounters:  07/09/19 160 lb (72.6 kg)  05/29/19 167 lb (75.8 kg)  11/26/18 156 lb (70.8 kg)    Temp 98 F (36.7 C) (Oral)   Wt 160 lb (72.6 kg)   LMP 06/18/2019 (Approximate)   BMI 25.06 kg/m   Assessment and Plan: 1. Acute maxillary sinusitis, recurrence not specified Patient with acute symptoms since Saturday.  Persistent.  Uncontrolled.  Patient is likely evolved into a sinusitis of the maxillary sinuses bilateral.  Will initiate Augmentin 875 mg 1 twice a day.  Patient has been instructed if she develops fever or worsening of symptoms is to call back or to go to be checked for Covid concerns. - amoxicillin-clavulanate (AUGMENTIN) 875-125 MG tablet; Take 1 tablet by mouth 2 (two) times daily.  Dispense: 20 tablet; Refill: 0  2. Cough New onset.  Began today.  We will initiate Tessalon Perles 1 capsule twice a day as needed for cough - benzonatate (TESSALON) 100 MG capsule; Take 1 capsule (100 mg total) by mouth 2 (two) times daily as needed for cough.  Dispense: 20 capsule; Refill: 0

## 2019-07-30 ENCOUNTER — Ambulatory Visit: Payer: BC Managed Care – PPO | Admitting: Family Medicine

## 2019-09-10 ENCOUNTER — Ambulatory Visit: Payer: BC Managed Care – PPO | Admitting: Family Medicine

## 2019-09-10 ENCOUNTER — Encounter: Payer: Self-pay | Admitting: Family Medicine

## 2019-09-10 ENCOUNTER — Other Ambulatory Visit: Payer: Self-pay

## 2019-09-10 VITALS — BP 120/70 | HR 76 | Ht 67.0 in | Wt 164.0 lb

## 2019-09-10 DIAGNOSIS — G43709 Chronic migraine without aura, not intractable, without status migrainosus: Secondary | ICD-10-CM | POA: Diagnosis not present

## 2019-09-10 MED ORDER — TOPIRAMATE 25 MG PO TABS: 25.0000 mg | ORAL_TABLET | Freq: Two times a day (BID) | ORAL | 1 refills | Status: DC

## 2019-09-10 MED ORDER — SUMATRIPTAN SUCCINATE 25 MG PO TABS: 25.0000 mg | ORAL_TABLET | ORAL | 3 refills | Status: DC | PRN

## 2019-09-10 NOTE — Progress Notes (Signed)
Date:  09/10/2019   Name:  Misty Archer   DOB:  [birth year]   MRN:  [id number]   Chief Complaint: Headache (taking the sumatriptan, but still having chronic migraines that lst for 3 days)  Headache  This is a chronic problem. The current episode started more than 1 year ago. The problem occurs intermittently. The problem has been waxing and waning. The pain is located in the right unilateral region. The pain radiates to the right neck. The pain quality is similar to prior headaches. The quality of the pain is described as throbbing. The pain is at a severity of 10/10. The pain is moderate. Associated symptoms include blurred vision, dizziness, nausea, phonophobia, photophobia, scalp tenderness and a visual change. Pertinent negatives include no eye watering, facial sweating, loss of balance, numbness, rhinorrhea or tingling. She has tried triptans for the symptoms. The treatment provided moderate relief.    Lab Results  Component Value Date   CREATININE 0.81 11/26/2018   BUN 12 11/26/2018   NA 139 11/26/2018   K 4.5 11/26/2018   CL 103 11/26/2018   CO2 20 11/26/2018   Lab Results  Component Value Date   CHOL 165 11/26/2018   HDL 57 11/26/2018   LDLCALC 90 11/26/2018   TRIG 88 11/26/2018   CHOLHDL 2.9 11/26/2018   No results found for: TSH No results found for: HGBA1C Lab Results  Component Value Date   WBC 6.6 11/26/2018   HGB 12.7 11/26/2018   HCT 38.9 11/26/2018   MCV 93 11/26/2018   PLT 206 11/26/2018   No results found for: ALT, AST, GGT, ALKPHOS, BILITOT   Review of Systems  HENT: Negative for rhinorrhea.   Eyes: Positive for blurred vision and photophobia.  Gastrointestinal: Positive for nausea.  Neurological: Positive for dizziness and headaches. Negative for tingling, numbness and loss of balance.    There are no problems to display for this patient.   Allergies  Allergen Reactions  . Codeine Rash  . Latex Rash    Past Surgical History:  Procedure  Laterality Date  . TONSILLECTOMY    . TUBAL LIGATION      Social History   Tobacco Use  . Smoking status: Never Smoker  . Smokeless tobacco: Never Used  Substance Use Topics  . Alcohol use: No    Alcohol/week: 0.0 standard drinks  . Drug use: No     Medication list has been reviewed and updated.  Current Meds  Medication Sig  . acetaminophen (TYLENOL) 500 MG tablet Take 500 mg by mouth every 6 (six) hours as needed. For pain or headache   . albuterol (PROVENTIL HFA;VENTOLIN HFA) 108 (90 Base) MCG/ACT inhaler Inhale 2 puffs into the lungs every 6 (six) hours as needed for wheezing or shortness of breath.  Marland Kitchen albuterol (PROVENTIL) (2.5 MG/3ML) 0.083% nebulizer solution Take 3 mLs (2.5 mg total) by nebulization every 6 (six) hours as needed for wheezing or shortness of breath.  Marland Kitchen ipratropium-albuterol (DUONEB) 0.5-2.5 (3) MG/3ML SOLN Take 3 mLs by nebulization every 6 (six) hours as needed.  . SUMAtriptan (IMITREX) 25 MG tablet Take 1 tablet (25 mg total) by mouth every 2 (two) hours as needed for migraine (not to exceed 2 a day). May repeat in 2 hours if headache persists or recurs.    PHQ 2/9 Scores 09/10/2019 07/09/2019 05/29/2019 11/26/2018  PHQ - 2 Score 0 0 0 0  PHQ- 9 Score 0 0 0 0    BP Readings from Last 3  Encounters:  09/10/19 120/70  05/29/19 118/72  11/26/18 124/80    Physical Exam  Wt Readings from Last 3 Encounters:  09/10/19 164 lb (74.4 kg)  07/09/19 160 lb (72.6 kg)  05/29/19 167 lb (75.8 kg)    BP 120/70   Pulse 76   Ht '5\' 7"'  (1.702 m)   Wt 164 lb (74.4 kg)   LMP 08/22/2019 (Exact Date)   BMI 25.69 kg/m   Assessment and Plan:  1. Chronic migraine without aura without status migrainosus, not intractable Chronic.  Episodic.  Uncontrolled but stable.  Patient is using Imitrex to control headaches to the best of her ability but this has not been optimal.  We will initiate Topamax 25 mg at night to help with sleep and provide possible prevention for  headaches.  In the meantime we will refer to neurology for evaluation. - SUMAtriptan (IMITREX) 25 MG tablet; Take 1 tablet (25 mg total) by mouth every 2 (two) hours as needed for migraine (not to exceed 2 a day). May repeat in 2 hours if headache persists or recurs.  Dispense: 10 tablet; Refill: 3 - topiramate (TOPAMAX) 25 MG tablet; Take 1 tablet (25 mg total) by mouth 2 (two) times daily. Start once at night until appt  Dispense: 60 tablet; Refill: 1 - Ambulatory referral to Neurology

## 2019-09-19 ENCOUNTER — Telehealth: Payer: BC Managed Care – PPO | Admitting: Family Medicine

## 2019-09-19 ENCOUNTER — Emergency Department (HOSPITAL_COMMUNITY): Payer: BC Managed Care – PPO

## 2019-09-19 ENCOUNTER — Other Ambulatory Visit: Payer: Self-pay

## 2019-09-19 ENCOUNTER — Emergency Department (HOSPITAL_COMMUNITY)
Admission: EM | Admit: 2019-09-19 | Discharge: 2019-09-20 | Disposition: A | Discharge status: Home / Self Care | Payer: BC Managed Care – PPO | Attending: Emergency Medicine | Admitting: Emergency Medicine

## 2019-09-19 ENCOUNTER — Encounter (HOSPITAL_COMMUNITY): Payer: Self-pay | Admitting: Emergency Medicine

## 2019-09-19 DIAGNOSIS — Z91040 Latex allergy status: Secondary | ICD-10-CM | POA: Diagnosis not present

## 2019-09-19 DIAGNOSIS — N12 Tubulo-interstitial nephritis, not specified as acute or chronic: Secondary | ICD-10-CM | POA: Diagnosis not present

## 2019-09-19 DIAGNOSIS — N3090 Cystitis, unspecified without hematuria: Secondary | ICD-10-CM | POA: Insufficient documentation

## 2019-09-19 DIAGNOSIS — J45909 Unspecified asthma, uncomplicated: Secondary | ICD-10-CM | POA: Diagnosis not present

## 2019-09-19 DIAGNOSIS — Z79899 Other long term (current) drug therapy: Secondary | ICD-10-CM | POA: Diagnosis not present

## 2019-09-19 DIAGNOSIS — R109 Unspecified abdominal pain: Secondary | ICD-10-CM | POA: Diagnosis present

## 2019-09-19 LAB — URINALYSIS, ROUTINE W REFLEX MICROSCOPIC
Bilirubin Urine: NEGATIVE
Glucose, UA: NEGATIVE mg/dL
Ketones, ur: NEGATIVE mg/dL
Nitrite: POSITIVE — AB
Protein, ur: >=300 mg/dL — AB
Specific Gravity, Urine: 1.012 (ref 1.005–1.030)
WBC, UA: >50 WBC/hpf — ABNORMAL HIGH (ref 0–5)
pH: 7 (ref 5.0–8.0)

## 2019-09-19 LAB — COMPREHENSIVE METABOLIC PANEL WITH GFR
ALT: 35 U/L (ref 0–44)
AST: 35 U/L (ref 15–41)
Albumin: 3.4 g/dL — ABNORMAL LOW (ref 3.5–5.0)
Alkaline Phosphatase: 34 U/L — ABNORMAL LOW (ref 38–126)
Anion gap: 11 (ref 5–15)
BUN: 10 mg/dL (ref 6–20)
CO2: 20 mmol/L — ABNORMAL LOW (ref 22–32)
Calcium: 9.1 mg/dL (ref 8.9–10.3)
Chloride: 111 mmol/L (ref 98–111)
Creatinine, Ser: 1.02 mg/dL — ABNORMAL HIGH (ref 0.44–1.00)
GFR calc Af Amer: >60 mL/min
GFR calc non Af Amer: >60 mL/min
Glucose, Bld: 122 mg/dL — ABNORMAL HIGH (ref 70–99)
Potassium: 3.8 mmol/L (ref 3.5–5.1)
Sodium: 142 mmol/L (ref 135–145)
Total Bilirubin: 1.3 mg/dL — ABNORMAL HIGH (ref 0.3–1.2)
Total Protein: 6.6 g/dL (ref 6.5–8.1)

## 2019-09-19 LAB — I-STAT BETA HCG BLOOD, ED (MC, WL, AP ONLY): I-stat hCG, quantitative: <5 m[IU]/mL

## 2019-09-19 LAB — CBC
HCT: 36.7 % (ref 36.0–46.0)
Hemoglobin: 11.3 g/dL — ABNORMAL LOW (ref 12.0–15.0)
MCH: 30.7 pg (ref 26.0–34.0)
MCHC: 30.8 g/dL (ref 30.0–36.0)
MCV: 99.7 fL (ref 80.0–100.0)
Platelets: 207 10*3/uL (ref 150–400)
RBC: 3.68 MIL/uL — ABNORMAL LOW (ref 3.87–5.11)
RDW: 11.9 % (ref 11.5–15.5)
WBC: 9.8 10*3/uL (ref 4.0–10.5)
nRBC: 0 % (ref 0.0–0.2)

## 2019-09-19 LAB — LIPASE, BLOOD: Lipase: 18 U/L (ref 11–51)

## 2019-09-19 LAB — LACTIC ACID, PLASMA: Lactic Acid, Venous: 1.4 mmol/L (ref 0.5–1.9)

## 2019-09-19 MED ORDER — HYDROMORPHONE HCL 1 MG/ML IJ SOLN
1.0000 mg | Freq: Once | INTRAMUSCULAR | Status: AC
  Administered 2019-09-19: 1 mg via INTRAVENOUS
  Filled 2019-09-19: qty 1

## 2019-09-19 MED ORDER — SODIUM CHLORIDE 0.9% FLUSH: 3.0000 mL | Freq: Once | INTRAVENOUS | Status: DC

## 2019-09-19 MED ORDER — ONDANSETRON HCL 4 MG/2ML IJ SOLN
4.0000 mg | Freq: Once | INTRAMUSCULAR | Status: AC
  Administered 2019-09-19: 4 mg via INTRAVENOUS
  Filled 2019-09-19: qty 2

## 2019-09-19 MED ORDER — HYDROCODONE-ACETAMINOPHEN 5-325 MG PO TABS: 1.0000 | ORAL_TABLET | Freq: Four times a day (QID) | ORAL | 0 refills | Status: AC | PRN

## 2019-09-19 MED ORDER — SODIUM CHLORIDE 0.9 % IV BOLUS
1000.0000 mL | Freq: Once | INTRAVENOUS | Status: AC
  Administered 2019-09-19: 1000 mL via INTRAVENOUS

## 2019-09-19 MED ORDER — SODIUM CHLORIDE 0.9 % IV SOLN
1.0000 g | Freq: Once | INTRAVENOUS | Status: AC
  Administered 2019-09-19: 1 g via INTRAVENOUS
  Filled 2019-09-19: qty 10

## 2019-09-19 MED ORDER — ONDANSETRON HCL 4 MG PO TABS: 4.0000 mg | ORAL_TABLET | Freq: Three times a day (TID) | ORAL | 0 refills | Status: DC | PRN

## 2019-09-19 MED ORDER — ACETAMINOPHEN 325 MG PO TABS
650.0000 mg | ORAL_TABLET | Freq: Once | ORAL | Status: AC | PRN
  Administered 2019-09-19: 650 mg via ORAL
  Filled 2019-09-19: qty 2

## 2019-09-19 MED ORDER — IOHEXOL 300 MG/ML  SOLN
100.0000 mL | Freq: Once | INTRAMUSCULAR | Status: AC | PRN
  Administered 2019-09-19: 100 mL via INTRAVENOUS

## 2019-09-19 MED ORDER — CEPHALEXIN 500 MG PO CAPS: 500.0000 mg | ORAL_CAPSULE | Freq: Four times a day (QID) | ORAL | 0 refills | Status: AC

## 2019-09-19 NOTE — Discharge Instructions (Addendum)
I am placing you on three medications:  1) Keflex is an antibiotic that you need to take four times per day for the next 10 days. Please complete this antibiotic in its entirety.   2) Zofran is a medication that you can take as needed for nausea. Please only take this medication if you are experiencing nausea that prohibits you from eating and drinking normally.  3) I am also giving a short course of norco which is a strong narcotic. Please take this medication for "break through" pain that can't be controlled with tylenol and ibuprofen. Remember that this medication has tylenol in it and not to exceed 4000mg  of tylenol per day. This medication can also be constipating so be sure to take a stool softener with it. Also, please be sure not to operate a motor vehicle or any heavy machinery after taking this medication.   Please do not hesitate to return to the emergency department with any new or worsening symptoms.

## 2019-09-19 NOTE — ED Provider Notes (Signed)
[name] DEPART[facility] Provider Note   CSN: [id number] Arrival date & time: 09/19/19  1228     History Chief Complaint  Patient presents with  . Abdominal Pain    Misty Archer is a 39 y.o. female.  HPI HPI Comments: Misty Archer is a 39 y.o. female who presents to the Emergency Department complaining of worsening abdominal pain for 1 day.  Patient states she was experiencing a migraine yesterday which she has a history of.  She took a sumatriptan which provided relief of her symptoms.  Later in the day she began experiencing centralized abdominal pain which she states radiates to her right flank.  Her pain is now 10/10.  She reports associated fever, chills, nausea, intractable vomiting, decreased p.o. intake.  With further questioning she also notes increased urinary frequency for the past week with darker more foul-smelling odor as well as dysuria.  She is currently menstruating.  She denies any alcohol use, drugs, history of smoking.  She is sexually active with 1 female partner in a monogamous relationship and denies using protection.  She denies URI symptoms, chest pain, shortness of breath, diarrhea, constipation, syncope, dizziness, lightheadedness.    Past Medical History:  Diagnosis Date  . Asthma     There are no problems to display for this patient.   Past Surgical History:  Procedure Laterality Date  . TONSILLECTOMY    . TUBAL LIGATION       OB History    Gravida  2   Para  1   Term  1   Preterm  0   AB  0   Living        SAB  0   TAB  0   Ectopic  0   Multiple      Live Births              Family History  Problem Relation Age of Onset  . Diabetes Mother   . Hypertension Mother   . Diabetes Father   . Hypertension Father     Social History   Tobacco Use  . Smoking status: Never Smoker  . Smokeless tobacco: Never Used  Substance Use Topics  . Alcohol use: No    Alcohol/week: 0.0 standard drinks  .  Drug use: No    Home Medications Prior to Admission medications   Medication Sig Start Date End Date Taking? Authorizing Provider  acetaminophen (TYLENOL) 500 MG tablet Take 500 mg by mouth every 6 (six) hours as needed. For pain or headache    Yes [provider]  albuterol (PROVENTIL HFA;VENTOLIN HFA) 108 (90 Base) MCG/ACT inhaler Inhale 2 puffs into the lungs every 6 (six) hours as needed for wheezing or shortness of breath. 06/25/18  Yes Juline Patch, MD  albuterol (PROVENTIL) (2.5 MG/3ML) 0.083% nebulizer solution Take 3 mLs (2.5 mg total) by nebulization every 6 (six) hours as needed for wheezing or shortness of breath. 06/24/16  Yes Juline Patch, MD  SUMAtriptan (IMITREX) 25 MG tablet Take 1 tablet (25 mg total) by mouth every 2 (two) hours as needed for migraine (not to exceed 2 a day). May repeat in 2 hours if headache persists or recurs. Patient taking differently: Take 25 mg by mouth every 2 (two) hours as needed for migraine. May repeat in 2 hours if headache persists or recurs. Not to exceed 2 tablets a day. 09/10/19  Yes Juline Patch, MD  topiramate (TOPAMAX) 25 MG tablet Take 1  tablet (25 mg total) by mouth 2 (two) times daily. Start once at night until appt 09/10/19  Yes Duanne Limerick, MD  ipratropium-albuterol (DUONEB) 0.5-2.5 (3) MG/3ML SOLN Take 3 mLs by nebulization every 6 (six) hours as needed. Patient not taking: Reported on 09/19/2019 10/20/17   Duanne Limerick, MD    Allergies    Codeine and Latex  Review of Systems   Review of Systems  All other systems reviewed and are negative. Ten systems reviewed and are negative for acute change, except as noted in the HPI.   Physical Exam Updated Vital Signs BP 133/77   Pulse 93   Temp (!) 100.9 F (38.3 C) (Oral)   Resp 18   LMP 08/22/2019 (Exact Date)   SpO2 99%   Physical Exam Vitals and nursing note reviewed.  Constitutional:      General: She is in acute distress.     Appearance: She is  well-developed and normal weight. She is ill-appearing. She is not toxic-appearing or diaphoretic.  HENT:     Head: Normocephalic and atraumatic.  Eyes:     Extraocular Movements: Extraocular movements intact.  Cardiovascular:     Rate and Rhythm: Normal rate.     Heart sounds: Normal heart sounds. No murmur. No friction rub. No gallop.   Pulmonary:     Effort: Pulmonary effort is normal. No respiratory distress.     Breath sounds: Normal breath sounds. No stridor. No wheezing, rhonchi or rales.  Abdominal:     General: Abdomen is flat. There is no distension. There are no signs of injury.     Palpations: Abdomen is soft.     Tenderness: There is abdominal tenderness in the periumbilical area and suprapubic area. There is right CVA tenderness. There is no left CVA tenderness, guarding or rebound. Negative signs include Murphy's sign and McBurney's sign.     Hernia: No hernia is present.  Skin:    General: Skin is warm and dry.  Neurological:     General: No focal deficit present.     Mental Status: She is alert and oriented to person, place, and time.  Psychiatric:        Mood and Affect: Mood normal.        Behavior: Behavior normal.    ED Results / Procedures / Treatments   Labs (all labs ordered are listed, but only abnormal results are displayed) Labs Reviewed  COMPREHENSIVE METABOLIC PANEL - Abnormal; Notable for the following components:      Result Value   CO2 20 (*)    Glucose, Bld 122 (*)    Creatinine, Ser 1.02 (*)    Albumin 3.4 (*)    Alkaline Phosphatase 34 (*)    Total Bilirubin 1.3 (*)    All other components within normal limits  CBC - Abnormal; Notable for the following components:   RBC 3.68 (*)    Hemoglobin 11.3 (*)    All other components within normal limits  URINALYSIS, ROUTINE W REFLEX MICROSCOPIC - Abnormal; Notable for the following components:   APPearance CLOUDY (*)    Hgb urine dipstick MODERATE (*)    Protein, ur >=300 (*)    Nitrite  POSITIVE (*)    Leukocytes,Ua LARGE (*)    WBC, UA >50 (*)    Bacteria, UA MANY (*)    All other components within normal limits  URINE CULTURE  LIPASE, BLOOD  LACTIC ACID, PLASMA  I-STAT BETA HCG BLOOD, ED ([facility], [facility], [facility] ONLY)  EKG None  Radiology CT ABDOMEN PELVIS W CONTRAST  Result Date: 09/19/2019 CLINICAL DATA:  Abdominal pain and fever. Right lower quadrant pain since yesterday. EXAM: CT ABDOMEN AND PELVIS WITH CONTRAST TECHNIQUE: Multidetector CT imaging of the abdomen and pelvis was performed using the standard protocol following bolus administration of intravenous contrast. CONTRAST:  OMNIPAQUE IOHEXOL 300 MG/ML  SOLN COMPARISON:  None. FINDINGS: Lower chest: Lung bases are clear. Hepatobiliary: No focal liver abnormality is seen. No gallstones, gallbladder wall thickening, or biliary dilatation. Pancreas: Unremarkable. No pancreatic ductal dilatation or surrounding inflammatory changes. Spleen: Normal in size without focal abnormality. Adrenals/Urinary Tract: Normal adrenal glands. Heterogeneous right renal enhancement with dilatation of the right renal pelvis and proximal ureteral urothelial enhancement. Minimal right perinephric edema. No evidence of renal or ureteral calculi. Homogeneous enhancement of the left kidney which is unremarkable. Urinary bladder is partially distended, but appears thick walled with mild adjacent fat stranding. Stomach/Bowel: Normal appendix, image 55 series 3. Nondistended stomach. Normal positioning of the ligament of Treitz. No small bowel obstruction or inflammation. Sigmoid colon is redundant. Moderate stool burden in the colon. No small or large bowel wall thickening or inflammation. Vascular/Lymphatic: Abdominal aorta is normal in caliber. Portal vein is patent. No acute vascular findings. No adenopathy. Reproductive: Retroverted uterus. Prominent periuterine and adnexal vascularity. No adnexal mass. Other: Small amount of free fluid in the pelvis,  likely physiologic. No free air. No intra-abdominal abscess. Small fat containing umbilical hernia. Musculoskeletal: Sclerosis about the iliac side of both sacroiliac joints. There are no acute or suspicious osseous abnormalities. IMPRESSION: 1. Findings consistent with cystitis and right pyelonephritis. No renal abscess. 2. Normal appendix. 3. Prominent periuterine and adnexal vascularity, can be seen with pelvic congestion syndrome in the appropriate clinical setting. Electronically Signed   By: Narda Rutherford M.D.   On: 09/19/2019 21:50    Procedures Procedures (including critical care time)  Medications Ordered in ED Medications  sodium chloride flush (NS) 0.9 % injection 3 mL (3 mLs Intravenous Not Given 09/19/19 2000)  cefTRIAXone (ROCEPHIN) 1 g in sodium chloride 0.9 % 100 mL IVPB (1 g Intravenous New Bag/Given 09/19/19 2321)  acetaminophen (TYLENOL) tablet 650 mg (650 mg Oral Given 09/19/19 1251)  sodium chloride 0.9 % bolus 1,000 mL (0 mLs Intravenous Stopped 09/19/19 2311)  ondansetron (ZOFRAN) injection 4 mg (4 mg Intravenous Given 09/19/19 2052)  HYDROmorphone (DILAUDID) injection 1 mg (1 mg Intravenous Given 09/19/19 2052)  iohexol (OMNIPAQUE) 300 MG/ML solution 100 mL (100 mLs Intravenous Contrast Given 09/19/19 2109)    ED Course  I have reviewed the triage vital signs and the nursing notes.  Pertinent labs & imaging results that were available during my care of the patient were reviewed by me and considered in my medical decision making (see chart for details).    MDM Rules/Calculators/A&P                      9:58 PM patient is a pleasant 39 year old female with 1 day of periumbilical abdominal pain, suprapubic abdominal pain, right CVA tenderness.  She was febrile 100.9.  Otherwise vital signs stable.  Basic labs obtained in triage. No leukocytosis.  UA shows a moderate amount of hemoglobin, protein, positive for nitrites, large leukocytes, white blood cells. She has intractable  nausea and vomiting.  Her pain is 10 out of 10.  She was given Zofran, Dilaudid, IV fluids.  She states her pain improved to 3 out of 10.   I  obtained a CT with contrast of the abdomen and pelvis showing a right-sided pyelonephritis as well as cystitis.  No renal cyst.  No other acute findings.  10:19 PM discussed findings with patient and she verbalized understanding of them.  She states she is feeling "much better".  Her temperature improved to 98.60F. She has no more nausea.  Will p.o. challenge.  Will give 1g of rocephin here in the ED.  11:48 PM pt tolerated abx and po challenge well and is ready to go home. Will give prescription for Zofran for breakthrough nausea.  Will give prescription for a short course of Norco for breakthrough pain.  Will place patient on 10 days of Keflex for her symptoms.  She understands to take this antibiotic in its entirety. She was given strict return precautions and knows to return to the emergency department with any new or worsening sx. Vital signs stable at the time of discharge.   Final Clinical Impression(s) / ED Diagnoses Final diagnoses:  Pyelonephritis  Cystitis    Rx / DC Orders ED Discharge Orders         Ordered    cephALEXin (KEFLEX) 500 MG capsule  4 times daily     09/19/19 2218    ondansetron (ZOFRAN) 4 MG tablet  Every 8 hours PRN     09/19/19 2218    HYDROcodone-acetaminophen (NORCO/VICODIN) 5-325 MG tablet  Every 6 hours PRN     09/19/19 2218           Placido Sou, PA-C 09/20/19 0004    Charlynne Pander, MD 09/20/19 1651

## 2019-09-19 NOTE — ED Provider Notes (Signed)
Patient seen by myself in conjunction with PA Joldersma.  Refer to provider note for full history and physical examination.  Briefly patient is a 39 year old female with history of asthma otherwise healthy presenting for evaluation of fever, nausea, vomiting, right flank pain and abdominal pain since yesterday.  Low-grade fever of 100.9 F in the ED but vital signs otherwise stable.  No rebound or guarding noted on examination of the abdomen.  UA is concerning for UTI, will culture.  Imaging today consistent with right pyelonephritis, no evidence of abscess or acute surgical abdominal pathology including obstruction or perforation.  She received IV fluids, antiemetics and pain medicine in the ED and on reevaluation reports feeling much better.  She is tolerating ginger ale and graham crackers in the ED.  Serial abdominal examinations remain benign.  She will follow-up with her PCP for reevaluation on an outpatient basis.  She was given a dose of IV Rocephin in the ED and will be discharged on p.o. antibiotics and symptomatic management.  Discussed strict ED return precautions. Patient verbalized understanding of and agreement with plan and is safe for discharge home at this time.    Misty Sewer, PA-C 09/19/19 2336    Misty Pander, MD 09/20/19 [phone]

## 2019-09-19 NOTE — ED Triage Notes (Signed)
Pt reports RLQ pain since yesterday, states pain goes from umbilicus to R abdomen and wraps around to R lower back. Pain worse with ambulation, endorses n/v, diarrhea. Tried to do televisit with pcp who sent her here

## 2019-09-22 LAB — URINE CULTURE: Culture: >=100000 — AB

## 2019-09-23 ENCOUNTER — Telehealth: Payer: Self-pay | Admitting: *Deleted

## 2019-09-23 NOTE — Telephone Encounter (Signed)
Post ED Visit - Positive Culture Follow-up  Culture report reviewed by antimicrobial stewardship pharmacist: [facility] []  , Pharm.D. []  Enzo Bi, Pharm.D., BCPS AQ-ID []  , Pharm.D., BCPS []  Celedonio Miyamoto, .D., BCPS []  [place], .D., BCPS, AAHIVP []  Georgina Pillion, Pharm.D., BCPS, AAHIVP []  [address], PharmD, BCPS []  , PharmD, BCPS []  [place], PharmD, BCPS []  [address], PharmD []  , PharmD, BCPS []  Estella Husk, PharmD , PharmD  [facility] []  , PharmD []  Phillips Climes, PharmD []  , PharmD []  Agapito Games, Rph []  ) Verlan Friends, PharmD []  , PharmD []  Mervyn Gay, PharmD []  , PharmD []  Vinnie Level, PharmD []  Jettie Pagan, PharmD []  [facility], PharmD []  , PharmD []  Len Childs, PharmD   Positive urine culture Treated with Cephalexin, organism sensitive to the same and no further patient follow-up is required at this time.  [facility] 09/23/2019, 10:12 AM

## 2019-10-02 ENCOUNTER — Other Ambulatory Visit: Payer: Self-pay | Admitting: Family Medicine

## 2019-10-02 DIAGNOSIS — G43709 Chronic migraine without aura, not intractable, without status migrainosus: Secondary | ICD-10-CM

## 2019-10-02 NOTE — Telephone Encounter (Signed)
Requested medication (s) are due for refill today - short term Rx  Requested medication (s) are on the active medication list -yes  Future visit scheduled -no  Last refill: 09/10/19  Notes to clinic: Request for non delegated Rx- given short term  Requested Prescriptions  Pending Prescriptions Disp Refills   topiramate (TOPAMAX) 25 MG tablet [Pharmacy Med Name: TOPIRAMATE 25 MG TABLET] 60 tablet 1    Sig: TAKE 1 TABLET BY MOUTH 2 TIMES DAILY. START ONCE AT NIGHT UNTIL APPT      Not Delegated - Neurology: Anticonvulsants - topiramate & zonisamide Failed - 10/02/2019 11:31 AM      Failed - This refill cannot be delegated      Failed - Cr in normal range and within 360 days    Creatinine, Ser  Date Value Ref Range Status  09/19/2019 1.02 (H) 0.44 - 1.00 mg/dL Final          Failed - CO2 in normal range and within 360 days    CO2  Date Value Ref Range Status  09/19/2019 20 (L) 22 - 32 mmol/L Final          Passed - Valid encounter within last 12 months    Recent Outpatient Visits           3 weeks ago Chronic migraine without aura without status migrainosus, not intractable   [facility] Duanne Limerick, MD   2 months ago Acute maxillary sinusitis, recurrence not specified   [facility] Duanne Limerick, MD   4 months ago Chronic migraine without aura without status migrainosus, not intractable   [facility] Duanne Limerick, MD   10 months ago Annual physical exam   [facility] Duanne Limerick, MD   1 year ago Encounter for physical examination related to employment   [facility] Duanne Limerick, MD                  Requested Prescriptions  Pending Prescriptions Disp Refills   topiramate (TOPAMAX) 25 MG tablet [Pharmacy Med Name: TOPIRAMATE 25 MG TABLET] 60 tablet 1    Sig: TAKE 1 TABLET BY MOUTH 2 TIMES DAILY. START ONCE AT NIGHT UNTIL APPT      Not Delegated - Neurology: Anticonvulsants - topiramate &  zonisamide Failed - 10/02/2019 11:31 AM      Failed - This refill cannot be delegated      Failed - Cr in normal range and within 360 days    Creatinine, Ser  Date Value Ref Range Status  09/19/2019 1.02 (H) 0.44 - 1.00 mg/dL Final          Failed - CO2 in normal range and within 360 days    CO2  Date Value Ref Range Status  09/19/2019 20 (L) 22 - 32 mmol/L Final          Passed - Valid encounter within last 12 months    Recent Outpatient Visits           3 weeks ago Chronic migraine without aura without status migrainosus, not intractable   [facility] Duanne Limerick, MD   2 months ago Acute maxillary sinusitis, recurrence not specified   [facility] Duanne Limerick, MD   4 months ago Chronic migraine without aura without status migrainosus, not intractable   [facility] Duanne Limerick, MD   10 months ago Annual physical exam   [facility]  Juline Patch, MD   1 year ago Encounter for physical examination related to employment   [facility] Juline Patch, MD

## 2019-10-02 NOTE — Telephone Encounter (Signed)
Called pt and she stated that she still has medication and is aware that if she needs a refill to contact Dr. Malvin Johns.

## 2020-07-06 ENCOUNTER — Ambulatory Visit: Payer: Self-pay | Admitting: Obstetrics & Gynecology

## 2021-07-19 ENCOUNTER — Other Ambulatory Visit: Payer: Self-pay

## 2021-07-19 ENCOUNTER — Encounter: Payer: Self-pay | Admitting: Family Medicine

## 2021-07-19 ENCOUNTER — Ambulatory Visit: Payer: BC Managed Care – PPO | Admitting: Family Medicine

## 2021-07-19 VITALS — BP 110/70 | HR 72 | Ht 67.0 in | Wt 165.0 lb

## 2021-07-19 DIAGNOSIS — Z227 Latent tuberculosis: Secondary | ICD-10-CM

## 2021-07-19 DIAGNOSIS — Z021 Encounter for pre-employment examination: Secondary | ICD-10-CM

## 2021-07-19 NOTE — Progress Notes (Signed)
Date:  07/19/2021   Name:  Misty Archer   DOB:  12/14/1980   MRN:  [id number]   Chief Complaint: Employment Physical (Tb gold drawn and forms filled out)  Patient is a 41 year old female who presents for a employment medical clearance exam. The patient reports the following problems: none. Health maintenance has been reviewed up to date.     Lab Results  Component Value Date   NA 142 09/19/2019   K 3.8 09/19/2019   CO2 20 (L) 09/19/2019   GLUCOSE 122 (H) 09/19/2019   BUN 10 09/19/2019   CREATININE 1.02 (H) 09/19/2019   CALCIUM 9.1 09/19/2019   GFRNONAA >60 09/19/2019   Lab Results  Component Value Date   CHOL 165 11/26/2018   HDL 57 11/26/2018   LDLCALC 90 11/26/2018   TRIG 88 11/26/2018   CHOLHDL 2.9 11/26/2018   No results found for: TSH No results found for: HGBA1C Lab Results  Component Value Date   WBC 9.8 09/19/2019   HGB 11.3 (L) 09/19/2019   HCT 36.7 09/19/2019   MCV 99.7 09/19/2019   PLT 207 09/19/2019   Lab Results  Component Value Date   ALT 35 09/19/2019   AST 35 09/19/2019   ALKPHOS 34 (L) 09/19/2019   BILITOT 1.3 (H) 09/19/2019   No results found for: 25OHVITD2, 25OHVITD3, VD25OH   Review of Systems  Constitutional:  Negative for chills and fever.  HENT:  Negative for drooling, ear discharge, ear pain and sore throat.   Respiratory:  Negative for cough, shortness of breath and wheezing.   Cardiovascular:  Negative for chest pain, palpitations and leg swelling.  Gastrointestinal:  Negative for abdominal pain, blood in stool, constipation, diarrhea and nausea.  Endocrine: Negative for polydipsia.  Genitourinary:  Negative for dysuria, frequency, hematuria and urgency.  Musculoskeletal:  Negative for back pain, myalgias and neck pain.  Skin:  Negative for rash.  Allergic/Immunologic: Negative for environmental allergies.  Neurological:  Negative for dizziness and headaches.  Hematological:  Does not bruise/bleed easily.   Psychiatric/Behavioral:  Negative for suicidal ideas. The patient is not nervous/anxious.    There are no problems to display for this patient.   Allergies  Allergen Reactions   Codeine Rash   Latex Rash    Past Surgical History:  Procedure Laterality Date   TONSILLECTOMY     TUBAL LIGATION      Social History   Tobacco Use   Smoking status: Never   Smokeless tobacco: Never  Substance Use Topics   Alcohol use: No    Alcohol/week: 0.0 standard drinks   Drug use: No     Medication list has been reviewed and updated.  Current Meds  Medication Sig   acetaminophen (TYLENOL) 500 MG tablet Take 500 mg by mouth every 6 (six) hours as needed. For pain or headache    albuterol (PROVENTIL HFA;VENTOLIN HFA) 108 (90 Base) MCG/ACT inhaler Inhale 2 puffs into the lungs every 6 (six) hours as needed for wheezing or shortness of breath.   albuterol (PROVENTIL) (2.5 MG/3ML) 0.083% nebulizer solution Take 3 mLs (2.5 mg total) by nebulization every 6 (six) hours as needed for wheezing or shortness of breath.   ondansetron (ZOFRAN) 4 MG tablet Take 1 tablet (4 mg total) by mouth every 8 (eight) hours as needed for nausea or vomiting.    PHQ 2/9 Scores 07/19/2021 09/10/2019 07/09/2019 05/29/2019  PHQ - 2 Score 0 0 0 0  PHQ- 9 Score 0 0 0  0    GAD 7 : Generalized Anxiety Score 07/19/2021 09/10/2019 07/09/2019  Nervous, Anxious, on Edge 0 0 0  Control/stop worrying 0 0 0  Worry too much - different things 0 0 0  Trouble relaxing 0 0 0  Restless 0 0 0  Easily annoyed or irritable 0 0 0  Afraid - awful might happen 0 0 0  Total GAD 7 Score 0 0 0  Anxiety Difficulty Not difficult at all - -    BP Readings from Last 3 Encounters:  07/19/21 110/70  09/19/19 109/66  09/10/19 120/70    Physical Exam Vitals and nursing note reviewed.  Constitutional:      Appearance: She is well-developed.  HENT:     Head: Normocephalic.     Right Ear: External ear normal.     Left Ear: External ear  normal.  Eyes:     General: Lids are everted, no foreign bodies appreciated. No scleral icterus.       Left eye: No foreign body or hordeolum.     Conjunctiva/sclera: Conjunctivae normal.     Right eye: Right conjunctiva is not injected.     Left eye: Left conjunctiva is not injected.     Pupils: Pupils are equal, round, and reactive to light.  Neck:     Thyroid: No thyromegaly.     Vascular: No JVD.     Trachea: No tracheal deviation.  Cardiovascular:     Rate and Rhythm: Normal rate and regular rhythm.     Heart sounds: Normal heart sounds. No murmur heard.   No friction rub. No gallop.  Pulmonary:     Effort: Pulmonary effort is normal. No respiratory distress.     Breath sounds: Normal breath sounds. No wheezing or rales.  Abdominal:     General: Bowel sounds are normal.     Palpations: Abdomen is soft. There is no mass.     Tenderness: There is no abdominal tenderness. There is no guarding or rebound.  Musculoskeletal:        General: No tenderness. Normal range of motion.     Cervical back: Normal range of motion and neck supple.  Lymphadenopathy:     Cervical: No cervical adenopathy.  Skin:    General: Skin is warm.     Findings: No rash.  Neurological:     Mental Status: She is alert.  Psychiatric:        Mood and Affect: Mood is not anxious or depressed.    Wt Readings from Last 3 Encounters:  07/19/21 165 lb (74.8 kg)  09/10/19 164 lb (74.4 kg)  07/09/19 160 lb (72.6 kg)    BP 110/70    Pulse 72    Ht 5\' 7"  (1.702 m)    Wt 165 lb (74.8 kg)    LMP 06/23/2021 (Exact Date)    BMI 25.84 kg/m   Assessment and Plan:  1. 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  Patient has no contraindication to employment and may proceed with no further referrals.  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 - QuantiFERON-TB Gold Plus

## 2021-07-22 LAB — QUANTIFERON-TB GOLD PLUS
QuantiFERON Mitogen Value: >10 IU/mL
QuantiFERON Nil Value: 0 IU/mL
QuantiFERON TB1 Ag Value: 0.02 IU/mL
QuantiFERON TB2 Ag Value: 0.01 IU/mL
QuantiFERON-TB Gold Plus: NEGATIVE

## 2021-07-23 ENCOUNTER — Encounter: Payer: Self-pay | Admitting: Family Medicine

## 2021-09-30 ENCOUNTER — Ambulatory Visit: Payer: Self-pay

## 2021-09-30 NOTE — Telephone Encounter (Signed)
Appt made for tomorrow ?Chief Complaint: itching and rash ?Symptoms: red rash itching ?Frequency: since Tuesday ?Pertinent Negatives: Patient denies blisters ?Disposition: [] ED /[] Urgent Care (no appt availability in office) / [x] Appointment(In office/virtual)/ []  [facility]/ [] Home Care/ [] Refused Recommended Disposition /[] [facility]/ []  Follow-up with PCP ?Additional Notes: appt made for tomorrow ? ? ? ? ? ? ? ? ?Reason for Disposition ? Hives or itching ? ?Answer Assessment - Initial Assessment Questions ?1. APPEARANCE of RASH: "Describe the rash." (e.g., spots, blisters, raised areas, skin peeling, scaly) ?    Red and splotchy ?2. SIZE: "How big are the spots?" (e.g., tip of pen, eraser, coin; inches, centimeters) ?    N/a ?3. LOCATION: "Where is the rash located?" ?    Arms, inner thighs, back, neck chest ?4. COLOR: "What color is the rash?" (Note: It is difficult to assess rash color in people with darker-colored skin. When this situation occurs, simply ask the caller to describe what they see.) ?    Red tone ?5. ONSET: "When did the rash begin?" ?    Tuesday ?6. FEVER: "Do you have a fever?" If Yes, ask: "What is your temperature, how was it measured, and when did it start?" ?    ni ?7. ITCHING: "Does the rash itch?" If Yes, ask: "How bad is the itch?" (Scale 1-10; or mild, moderate, severe) ?    moderate ?8. CAUSE: "What do you think is causing the rash?" ?    Exposure to poison ivey  ?9. NEW MEDICATION: "What new medication are you taking?" (e.g., name of antibiotic) "When did you start taking this medication?". ?    no ?10. OTHER SYMPTOMS: "Do you have any other symptoms?" (e.g., sore throat, fever, joint pain) ?      no ?11. PREGNANCY: "Is there any chance you are pregnant?" "When was your last menstrual period?" ?      *No Answer* ? ?Protocols used: Rash - Widespread On Drugs-A-AH ? ?

## 2021-10-01 ENCOUNTER — Ambulatory Visit: Payer: BC Managed Care – PPO | Admitting: Family Medicine

## 2021-12-20 DIAGNOSIS — N926 Irregular menstruation, unspecified: Secondary | ICD-10-CM | POA: Diagnosis not present

## 2021-12-20 DIAGNOSIS — Z01419 Encounter for gynecological examination (general) (routine) without abnormal findings: Secondary | ICD-10-CM | POA: Diagnosis not present

## 2021-12-20 DIAGNOSIS — R87615 Unsatisfactory cytologic smear of cervix: Secondary | ICD-10-CM | POA: Diagnosis not present

## 2021-12-20 DIAGNOSIS — R5383 Other fatigue: Secondary | ICD-10-CM | POA: Diagnosis not present

## 2021-12-20 DIAGNOSIS — Z9851 Tubal ligation status: Secondary | ICD-10-CM | POA: Diagnosis not present

## 2021-12-20 DIAGNOSIS — Z124 Encounter for screening for malignant neoplasm of cervix: Secondary | ICD-10-CM | POA: Diagnosis not present

## 2021-12-20 DIAGNOSIS — N921 Excessive and frequent menstruation with irregular cycle: Secondary | ICD-10-CM | POA: Diagnosis not present

## 2022-01-07 ENCOUNTER — Ambulatory Visit (INDEPENDENT_AMBULATORY_CARE_PROVIDER_SITE_OTHER): Payer: BLUE CROSS/BLUE SHIELD | Admitting: Family Medicine

## 2022-01-07 ENCOUNTER — Encounter: Payer: Self-pay | Admitting: Family Medicine

## 2022-01-07 VITALS — BP 110/70 | HR 76 | Ht 67.0 in | Wt 165.0 lb

## 2022-01-07 DIAGNOSIS — J4520 Mild intermittent asthma, uncomplicated: Secondary | ICD-10-CM

## 2022-01-07 DIAGNOSIS — J219 Acute bronchiolitis, unspecified: Secondary | ICD-10-CM | POA: Diagnosis not present

## 2022-01-07 MED ORDER — PREDNISONE 10 MG PO TABS: 10.0000 mg | ORAL_TABLET | Freq: Every day | ORAL | 0 refills | Status: DC

## 2022-01-07 MED ORDER — ALBUTEROL SULFATE HFA 108 (90 BASE) MCG/ACT IN AERS: 2.0000 | INHALATION_SPRAY | Freq: Four times a day (QID) | RESPIRATORY_TRACT | 11 refills | Status: DC | PRN

## 2022-01-07 MED ORDER — ALBUTEROL SULFATE (2.5 MG/3ML) 0.083% IN NEBU: 2.5000 mg | INHALATION_SOLUTION | Freq: Four times a day (QID) | RESPIRATORY_TRACT | 12 refills | Status: DC | PRN

## 2022-01-07 NOTE — Progress Notes (Signed)
Date:  01/07/2022   Name:  Misty Archer   DOB:  06-17-1980   MRN:  [id number]   Chief Complaint: Asthma  Asthma She complains of chest tightness, cough, frequent throat clearing and wheezing. There is no difficulty breathing, hemoptysis, hoarse voice, shortness of breath or sputum production. This is a chronic problem. The current episode started yesterday. The problem occurs 2 to 4 times per day. The problem has been waxing and waning. The cough is non-productive. Pertinent negatives include no chest pain, ear pain, fever, headaches, myalgias or sore throat. Her past medical history is significant for asthma.    Lab Results  Component Value Date   NA 142 09/19/2019   K 3.8 09/19/2019   CO2 20 (L) 09/19/2019   GLUCOSE 122 (H) 09/19/2019   BUN 10 09/19/2019   CREATININE 1.02 (H) 09/19/2019   CALCIUM 9.1 09/19/2019   GFRNONAA >60 09/19/2019   Lab Results  Component Value Date   CHOL 165 11/26/2018   HDL 57 11/26/2018   LDLCALC 90 11/26/2018   TRIG 88 11/26/2018   CHOLHDL 2.9 11/26/2018   No results found for: "TSH" No results found for: "HGBA1C" Lab Results  Component Value Date   WBC 9.8 09/19/2019   HGB 11.3 (L) 09/19/2019   HCT 36.7 09/19/2019   MCV 99.7 09/19/2019   PLT 207 09/19/2019   Lab Results  Component Value Date   ALT 35 09/19/2019   AST 35 09/19/2019   ALKPHOS 34 (L) 09/19/2019   BILITOT 1.3 (H) 09/19/2019   No results found for: "25OHVITD2", "25OHVITD3", "VD25OH"   Review of Systems  Constitutional:  Negative for chills and fever.  HENT:  Negative for ear pain, hoarse voice and sore throat.   Respiratory:  Positive for cough and wheezing. Negative for hemoptysis, sputum production and shortness of breath.   Cardiovascular:  Negative for chest pain, palpitations and leg swelling.  Gastrointestinal:  Negative for abdominal pain, blood in stool, constipation, diarrhea and nausea.  Endocrine: Negative for polydipsia and polyuria.  Genitourinary:   Negative for difficulty urinating, dysuria, frequency, hematuria and urgency.  Musculoskeletal:  Negative for back pain, myalgias and neck pain.  Skin:  Negative for rash.  Allergic/Immunologic: Negative for environmental allergies.  Neurological:  Negative for dizziness and headaches.  Hematological:  Does not bruise/bleed easily.  Psychiatric/Behavioral:  Negative for suicidal ideas. The patient is not nervous/anxious.     There are no problems to display for this patient.   Allergies  Allergen Reactions   Codeine Rash   Latex Rash    Past Surgical History:  Procedure Laterality Date   TONSILLECTOMY     TUBAL LIGATION      Social History   Tobacco Use   Smoking status: Never   Smokeless tobacco: Never  Substance Use Topics   Alcohol use: No    Alcohol/week: 0.0 standard drinks of alcohol   Drug use: No     Medication list has been reviewed and updated.  Current Meds  Medication Sig   acetaminophen (TYLENOL) 500 MG tablet Take 500 mg by mouth every 6 (six) hours as needed. For pain or headache    albuterol (PROVENTIL HFA;VENTOLIN HFA) 108 (90 Base) MCG/ACT inhaler Inhale 2 puffs into the lungs every 6 (six) hours as needed for wheezing or shortness of breath.   albuterol (PROVENTIL) (2.5 MG/3ML) 0.083% nebulizer solution Take 3 mLs (2.5 mg total) by nebulization every 6 (six) hours as needed for wheezing or shortness of breath.  ipratropium-albuterol (DUONEB) 0.5-2.5 (3) MG/3ML SOLN Take 3 mLs by nebulization every 6 (six) hours as needed.       01/07/2022    9:35 AM 07/19/2021    3:12 PM 09/10/2019    2:01 PM 07/09/2019   11:24 AM  GAD 7 : Generalized Anxiety Score  Nervous, Anxious, on Edge 0 0 0 0  Control/stop worrying 1 0 0 0  Worry too much - different things 1 0 0 0  Trouble relaxing 0 0 0 0  Restless 0 0 0 0  Easily annoyed or irritable 1 0 0 0  Afraid - awful might happen 0 0 0 0  Total GAD 7 Score 3 0 0 0  Anxiety Difficulty Not difficult at all Not  difficult at all         01/07/2022    9:35 AM 07/19/2021    3:12 PM 09/10/2019    2:00 PM  Depression screen PHQ 2/9  Decreased Interest 0 0 0  Down, Depressed, Hopeless 0 0 0  PHQ - 2 Score 0 0 0  Altered sleeping 1 0 0  Tired, decreased energy 2 0 0  Change in appetite 0 0 0  Feeling bad or failure about yourself  0 0 0  Trouble concentrating 0 0 0  Moving slowly or fidgety/restless 0 0 0  Suicidal thoughts 0 0 0  PHQ-9 Score 3 0 0  Difficult doing work/chores Not difficult at all      BP Readings from Last 3 Encounters:  01/07/22 110/70  07/19/21 110/70  09/19/19 109/66    Physical Exam Vitals and nursing note reviewed.  Constitutional:      Appearance: She is well-developed.  HENT:     Head: Normocephalic.     Right Ear: Tympanic membrane and external ear normal.     Left Ear: Tympanic membrane and external ear normal.     Nose: Nose normal.     Mouth/Throat:     Mouth: Mucous membranes are moist.  Eyes:     General: Lids are everted, no foreign bodies appreciated. No scleral icterus.       Left eye: No foreign body or hordeolum.     Conjunctiva/sclera: Conjunctivae normal.     Right eye: Right conjunctiva is not injected.     Left eye: Left conjunctiva is not injected.     Pupils: Pupils are equal, round, and reactive to light.  Neck:     Thyroid: No thyromegaly.     Vascular: No JVD.     Trachea: No tracheal deviation.  Cardiovascular:     Rate and Rhythm: Normal rate and regular rhythm.     Heart sounds: Normal heart sounds. No murmur heard.    No friction rub. No gallop.  Pulmonary:     Effort: Pulmonary effort is normal. No respiratory distress.     Breath sounds: Normal breath sounds. No wheezing, rhonchi or rales.  Abdominal:     General: Bowel sounds are normal.     Palpations: Abdomen is soft. There is no mass.     Tenderness: There is no abdominal tenderness. There is no guarding or rebound.  Musculoskeletal:        General: No tenderness.  Normal range of motion.     Cervical back: Normal range of motion and neck supple.  Lymphadenopathy:     Cervical: No cervical adenopathy.  Skin:    General: Skin is warm.     Findings: No rash.  Neurological:  Mental Status: She is alert and oriented to person, place, and time.     Cranial Nerves: No cranial nerve deficit.     Deep Tendon Reflexes: Reflexes normal.  Psychiatric:        Mood and Affect: Mood is not anxious or depressed.     Wt Readings from Last 3 Encounters:  01/07/22 165 lb (74.8 kg)  07/19/21 165 lb (74.8 kg)  09/10/19 164 lb (74.4 kg)    BP 110/70   Pulse 76   Ht 5\' 7"  (1.702 m)   Wt 165 lb (74.8 kg)   LMP 12/13/2021 (Exact Date)   SpO2 99%   BMI 25.84 kg/m   Assessment and Plan:  1. Mild intermittent asthma, unspecified whether complicated Chronic.  Acute onset this morning at daycare.  Controlled.  Stable.  Patient had out of date albuterol and came today to have refills and for reevaluation.  Patient was seen by EMS and was given a nebulization and is doing better and exam is noting no residual wheezing rhonchi rales or rubs.  Continue albuterol inhaler 2 puffs every 6 hours and nebulized  albuterol as well to alternate if necessary.  Patient is also been given prednisone just to cut down on the inflammatory component for about 7 to 10 days. - albuterol (VENTOLIN HFA) 108 (90 Base) MCG/ACT inhaler; Inhale 2 puffs into the lungs every 6 (six) hours as needed for wheezing or shortness of breath.  Dispense: 1 each; Refill: 11 - albuterol (PROVENTIL) (2.5 MG/3ML) 0.083% nebulizer solution; Take 3 mLs (2.5 mg total) by nebulization every 6 (six) hours as needed for wheezing or shortness of breath.  Dispense: 75 mL; Refill: 12 - predniSONE (DELTASONE) 10 MG tablet; Take 1 tablet (10 mg total) by mouth daily with breakfast.  Dispense: 30 tablet; Refill: 0  2. Bronchiolitis Patient does have a nonproductive cough but there is no rales or rhonchi suggesting  pneumonia.  Patient is not tachypneic and we will hold on any x-rays at this time. - albuterol (VENTOLIN HFA) 108 (90 Base) MCG/ACT inhaler; Inhale 2 puffs into the lungs every 6 (six) hours as needed for wheezing or shortness of breath.  Dispense: 1 each; Refill: 11 - albuterol (PROVENTIL) (2.5 MG/3ML) 0.083% nebulizer solution; Take 3 mLs (2.5 mg total) by nebulization every 6 (six) hours as needed for wheezing or shortness of breath.  Dispense: 75 mL; Refill: 12 - predniSONE (DELTASONE) 10 MG tablet; Take 1 tablet (10 mg total) by mouth daily with breakfast.  Dispense: 30 tablet; Refill: 0

## 2022-02-03 ENCOUNTER — Other Ambulatory Visit: Payer: Self-pay | Admitting: Family Medicine

## 2022-02-03 DIAGNOSIS — J4520 Mild intermittent asthma, uncomplicated: Secondary | ICD-10-CM

## 2022-02-03 DIAGNOSIS — J219 Acute bronchiolitis, unspecified: Secondary | ICD-10-CM

## 2022-02-03 NOTE — Telephone Encounter (Signed)
Requested medication (s) are due for refill today - provider review  Requested medication (s) are on the active medication list -yes  Future visit scheduled -no  Last refill: 01/07/22 #30  Notes to clinic: non delegated Rx, fails lab protocol  Requested Prescriptions  Pending Prescriptions Disp Refills   predniSONE (DELTASONE) 10 MG tablet [Pharmacy Med Name: PREDNISONE 10 MG TABLET] 30 tablet 0    Sig: TAKE 1 TABLET BY MOUTH DAILY WITH BREAKFAST     Not Delegated - Endocrinology:  Oral Corticosteroids Failed - 02/03/2022  2:00 AM      Failed - This refill cannot be delegated      Failed - Manual Review: Eye exam for IOP if prolonged treatment      Failed - Glucose (serum) in normal range and within 180 days    Glucose, Bld  Date Value Ref Range Status  09/19/2019 122 (H) 70 - 99 mg/dL Final    Comment:    Glucose reference range applies only to samples taken after fasting for at least 8 hours.         Failed - K in normal range and within 180 days    Potassium  Date Value Ref Range Status  09/19/2019 3.8 3.5 - 5.1 mmol/L Final         Failed - Na in normal range and within 180 days    Sodium  Date Value Ref Range Status  09/19/2019 142 135 - 145 mmol/L Final  11/26/2018 139 134 - 144 mmol/L Final         Failed - Bone Mineral Density or Dexa Scan completed in the last 2 years      Passed - Last BP in normal range    BP Readings from Last 1 Encounters:  01/07/22 110/70         Passed - Valid encounter within last 6 months    Recent Outpatient Visits           3 weeks ago Mild intermittent asthma, unspecified whether complicated   [place] [facility]   6 months ago Encounter for pre-employment examination   [place] [facility] at [facility], MD   2 years ago Chronic migraine without aura without status migrainosus, not intractable   [place] [facility], [facility]   2 years ago Acute maxillary sinusitis, recurrence not specified   [place] [facility] at [facility], MD   2 years ago Chronic migraine without aura without status migrainosus, not intractable    [facility] at [facility]                 Requested Prescriptions  Pending Prescriptions Disp Refills   predniSONE (DELTASONE) 10 MG tablet [Pharmacy Med Name: PREDNISONE 10 MG TABLET] 30 tablet 0    Sig: TAKE 1 TABLET BY MOUTH DAILY WITH BREAKFAST     Not Delegated - Endocrinology:  Oral Corticosteroids Failed - 02/03/2022  2:00 AM      Failed - This refill cannot be delegated      Failed - Manual Review: Eye exam for IOP if prolonged treatment      Failed - Glucose (serum) in normal range and within 180 days    Glucose, Bld  Date Value Ref  Range Status  09/19/2019 122 (H) 70 - 99 mg/dL Final    Comment:    Glucose reference range applies only to samples taken after fasting for at least 8 hours.         Failed - K in normal range and within 180 days    Potassium  Date Value Ref Range Status  09/19/2019 3.8 3.5 - 5.1 mmol/L Final         Failed - Na in normal range and within 180 days    Sodium  Date Value Ref Range Status  09/19/2019 142 135 - 145 mmol/L Final  11/26/2018 139 134 - 144 mmol/L Final         Failed - Bone Mineral Density or Dexa Scan completed in the last 2 years      Passed - Last BP in normal range    BP Readings from Last 1 Encounters:  01/07/22 110/70         Passed - Valid encounter within last 6 months    Recent Outpatient Visits           3 weeks ago Mild intermittent asthma, unspecified whether complicated   [place] [facility], MD   6 months ago Encounter for pre-employment examination   [place] [facility] at [facility], MD   2 years ago Chronic migraine without aura without status migrainosus, not intractable   [place] [facility], MD   2 years ago Acute maxillary sinusitis, recurrence not specified   [place] [facility], MD   2 years ago Chronic migraine without aura without status migrainosus, not intractable    [facility] at [facility], [facility]

## 2022-06-02 ENCOUNTER — Encounter: Payer: Self-pay | Admitting: Internal Medicine

## 2022-06-02 ENCOUNTER — Ambulatory Visit (INDEPENDENT_AMBULATORY_CARE_PROVIDER_SITE_OTHER): Payer: BLUE CROSS/BLUE SHIELD | Admitting: Internal Medicine

## 2022-06-02 ENCOUNTER — Encounter: Payer: Self-pay | Admitting: Family Medicine

## 2022-06-02 VITALS — BP 122/62 | HR 125 | Temp 102.0°F | Ht 67.0 in | Wt 162.8 lb

## 2022-06-02 DIAGNOSIS — R6889 Other general symptoms and signs: Secondary | ICD-10-CM

## 2022-06-02 DIAGNOSIS — J101 Influenza due to other identified influenza virus with other respiratory manifestations: Secondary | ICD-10-CM

## 2022-06-02 LAB — POCT INFLUENZA A/B
Influenza A, POC: POSITIVE — AB
Influenza B, POC: NEGATIVE

## 2022-06-02 LAB — POC COVID19 BINAXNOW: SARS Coronavirus 2 Ag: NEGATIVE

## 2022-06-02 MED ORDER — OSELTAMIVIR PHOSPHATE 75 MG PO CAPS: 75.0000 mg | ORAL_CAPSULE | Freq: Two times a day (BID) | ORAL | 0 refills | Status: AC

## 2022-06-02 NOTE — Progress Notes (Signed)
Date:  06/02/2022   Name:  Misty Archer   DOB:  23-Jun-1980   MRN:  [id number]   Chief Complaint: Fever (Stuffy nose, sinus pressure, congestion)  Fever  This is a new problem. The current episode started yesterday. The problem has been gradually worsening. The maximum temperature noted was 103 to 103.9 F. The temperature was taken using an oral thermometer. Associated symptoms include congestion, headaches and muscle aches. Pertinent negatives include no chest pain, coughing, diarrhea, nausea, sore throat, vomiting or wheezing. Associated symptoms comments: Sinus pressure, congestion. She has tried nothing for the symptoms.    Lab Results  Component Value Date   NA 142 09/19/2019   K 3.8 09/19/2019   CO2 20 (L) 09/19/2019   GLUCOSE 122 (H) 09/19/2019   BUN 10 09/19/2019   CREATININE 1.02 (H) 09/19/2019   CALCIUM 9.1 09/19/2019   GFRNONAA >60 09/19/2019   Lab Results  Component Value Date   CHOL 165 11/26/2018   HDL 57 11/26/2018   LDLCALC 90 11/26/2018   TRIG 88 11/26/2018   CHOLHDL 2.9 11/26/2018   No results found for: "TSH" No results found for: "HGBA1C" Lab Results  Component Value Date   WBC 9.8 09/19/2019   HGB 11.3 (L) 09/19/2019   HCT 36.7 09/19/2019   MCV 99.7 09/19/2019   PLT 207 09/19/2019   Lab Results  Component Value Date   ALT 35 09/19/2019   AST 35 09/19/2019   ALKPHOS 34 (L) 09/19/2019   BILITOT 1.3 (H) 09/19/2019   No results found for: "25OHVITD2", "25OHVITD3", "VD25OH"   Review of Systems  Constitutional:  Positive for fever.  HENT:  Positive for congestion. Negative for sore throat and trouble swallowing.   Respiratory:  Negative for cough, chest tightness, shortness of breath and wheezing.   Cardiovascular:  Negative for chest pain.  Gastrointestinal:  Negative for diarrhea, nausea and vomiting.  Neurological:  Positive for headaches. Negative for dizziness.  Psychiatric/Behavioral:  Negative for dysphoric mood and sleep disturbance.  The patient is not nervous/anxious.     There are no problems to display for this patient.   Allergies  Allergen Reactions   Codeine Rash and Hives   Latex Rash    Past Surgical History:  Procedure Laterality Date   TONSILLECTOMY     TUBAL LIGATION      Social History   Tobacco Use   Smoking status: Never   Smokeless tobacco: Never  Substance Use Topics   Alcohol use: No    Alcohol/week: 0.0 standard drinks of alcohol   Drug use: No     Medication list has been reviewed and updated.  Current Meds  Medication Sig   acetaminophen (TYLENOL) 500 MG tablet Take 500 mg by mouth every 6 (six) hours as needed. For pain or headache    albuterol (PROVENTIL) (2.5 MG/3ML) 0.083% nebulizer solution Take 3 mLs (2.5 mg total) by nebulization every 6 (six) hours as needed for wheezing or shortness of breath.   albuterol (VENTOLIN HFA) 108 (90 Base) MCG/ACT inhaler Inhale 2 puffs into the lungs every 6 (six) hours as needed for wheezing or shortness of breath.   ipratropium-albuterol (DUONEB) 0.5-2.5 (3) MG/3ML SOLN Take 3 mLs by nebulization every 6 (six) hours as needed.   oseltamivir (TAMIFLU) 75 MG capsule Take 1 capsule (75 mg total) by mouth 2 (two) times daily for 5 days.       06/02/2022   11:06 AM 01/07/2022    9:35 AM 07/19/2021  3:12 PM 09/10/2019    2:01 PM  GAD 7 : Generalized Anxiety Score  Nervous, Anxious, on Edge 0 0 0 0  Control/stop worrying 0 1 0 0  Worry too much - different things 1 1 0 0  Trouble relaxing 0 0 0 0  Restless 0 0 0 0  Easily annoyed or irritable 1 1 0 0  Afraid - awful might happen 0 0 0 0  Total GAD 7 Score 2 3 0 0  Anxiety Difficulty Not difficult at all Not difficult at all Not difficult at all        06/02/2022   11:05 AM 01/07/2022    9:35 AM 07/19/2021    3:12 PM  Depression screen PHQ 2/9  Decreased Interest 0 0 0  Down, Depressed, Hopeless 0 0 0  PHQ - 2 Score 0 0 0  Altered sleeping 1 1 0  Tired, decreased energy 1 2 0   Change in appetite 0 0 0  Feeling bad or failure about yourself  0 0 0  Trouble concentrating 0 0 0  Moving slowly or fidgety/restless 0 0 0  Suicidal thoughts 0 0 0  PHQ-9 Score 2 3 0  Difficult doing work/chores Not difficult at all Not difficult at all     BP Readings from Last 3 Encounters:  06/02/22 122/62  01/07/22 110/70  07/19/21 110/70    Physical Exam Vitals and nursing note reviewed.  Constitutional:      General: She is not in acute distress.    Appearance: Normal appearance. She is well-developed. She is ill-appearing.  HENT:     Head: Normocephalic and atraumatic.  Cardiovascular:     Rate and Rhythm: Normal rate and regular rhythm.     Heart sounds: No murmur heard. Pulmonary:     Effort: Pulmonary effort is normal. No respiratory distress.     Breath sounds: No wheezing or rhonchi.  Musculoskeletal:     Cervical back: Normal range of motion.     Right lower leg: No edema.     Left lower leg: No edema.  Skin:    General: Skin is warm and dry.     Findings: No rash.  Neurological:     Mental Status: She is alert and oriented to person, place, and time.  Psychiatric:        Mood and Affect: Mood normal.        Behavior: Behavior normal.     Wt Readings from Last 3 Encounters:  06/02/22 162 lb 12.8 oz (73.8 kg)  01/07/22 165 lb (74.8 kg)  07/19/21 165 lb (74.8 kg)    BP 122/62   Pulse (!) 125   Temp (!) 102 F (38.9 C) (Oral)   Ht 5\' 7"  (1.702 m)   Wt 162 lb 12.8 oz (73.8 kg)   SpO2 94%   BMI 25.50 kg/m   Assessment and Plan: Problem List Items Addressed This Visit   None Visit Diagnoses     Type A influenza    -  Primary   Tylenol every 8 hours push fluids rest   Relevant Medications   oseltamivir (TAMIFLU) 75 MG capsule   Flu-like symptoms       Relevant Orders   POC COVID-19 BinaxNow (Completed)   POCT Influenza A/B (Completed)        Partially dictated using Editor, commissioning. Any errors are unintentional.  Halina Maidens, MD [facility]  06/02/2022

## 2022-06-02 NOTE — Patient Instructions (Signed)
Tylenol 650 -1000 mg three times a day for the next few days  Plenty of fluids  Use your albuterol as needed

## 2023-04-20 DIAGNOSIS — R0789 Other chest pain: Secondary | ICD-10-CM | POA: Diagnosis not present

## 2023-07-14 ENCOUNTER — Encounter: Payer: Self-pay | Admitting: Family Medicine

## 2023-07-14 ENCOUNTER — Ambulatory Visit (INDEPENDENT_AMBULATORY_CARE_PROVIDER_SITE_OTHER): Payer: BLUE CROSS/BLUE SHIELD | Admitting: Family Medicine

## 2023-07-14 VITALS — BP 128/74 | HR 89 | Ht 67.0 in | Wt 170.0 lb

## 2023-07-14 DIAGNOSIS — J4520 Mild intermittent asthma, uncomplicated: Secondary | ICD-10-CM

## 2023-07-14 DIAGNOSIS — J219 Acute bronchiolitis, unspecified: Secondary | ICD-10-CM

## 2023-07-14 MED ORDER — ALBUTEROL SULFATE (2.5 MG/3ML) 0.083% IN NEBU: 2.5000 mg | INHALATION_SOLUTION | Freq: Four times a day (QID) | RESPIRATORY_TRACT | 12 refills | Status: AC | PRN

## 2023-07-14 MED ORDER — ALBUTEROL SULFATE HFA 108 (90 BASE) MCG/ACT IN AERS: 2.0000 | INHALATION_SPRAY | Freq: Four times a day (QID) | RESPIRATORY_TRACT | 11 refills | Status: AC | PRN

## 2023-07-14 NOTE — Progress Notes (Signed)
Date:  07/14/2023   Name:  Misty Archer   DOB:  1980/10/19   MRN:  [id number]   Chief Complaint: Asthma  Asthma She complains of chest tightness, cough, difficulty breathing, frequent throat clearing and wheezing. There is no hemoptysis, hoarse voice, shortness of breath or sputum production. This is a recurrent problem. The current episode started in the past 7 days. The problem occurs constantly. The problem has been waxing and waning. The cough is non-productive. Pertinent negatives include no chest pain, fever, headaches, orthopnea, PND, sneezing or sore throat. Her past medical history is significant for asthma.    Lab Results  Component Value Date   NA 142 09/19/2019   K 3.8 09/19/2019   CO2 20 (L) 09/19/2019   GLUCOSE 122 (H) 09/19/2019   BUN 10 09/19/2019   CREATININE 1.02 (H) 09/19/2019   CALCIUM 9.1 09/19/2019   GFRNONAA >60 09/19/2019   Lab Results  Component Value Date   CHOL 165 11/26/2018   HDL 57 11/26/2018   LDLCALC 90 11/26/2018   TRIG 88 11/26/2018   CHOLHDL 2.9 11/26/2018   No results found for: "TSH" No results found for: "HGBA1C" Lab Results  Component Value Date   WBC 9.8 09/19/2019   HGB 11.3 (L) 09/19/2019   HCT 36.7 09/19/2019   MCV 99.7 09/19/2019   PLT 207 09/19/2019   Lab Results  Component Value Date   ALT 35 09/19/2019   AST 35 09/19/2019   ALKPHOS 34 (L) 09/19/2019   BILITOT 1.3 (H) 09/19/2019   No results found for: "25OHVITD2", "25OHVITD3", "VD25OH"   Review of Systems  Constitutional:  Negative for fever.  HENT:  Negative for hoarse voice, sneezing and sore throat.   Eyes:  Negative for visual disturbance.  Respiratory:  Positive for cough, chest tightness and wheezing. Negative for apnea, hemoptysis, sputum production, choking, shortness of breath and stridor.   Cardiovascular:  Negative for chest pain, palpitations, leg swelling and PND.  Gastrointestinal:  Negative for abdominal pain.  Endocrine: Negative for polydipsia  and polyuria.  Neurological:  Negative for headaches.    There are no active problems to display for this patient.   Allergies  Allergen Reactions   Codeine Rash and Hives   Latex Rash    Past Surgical History:  Procedure Laterality Date   TONSILLECTOMY     TUBAL LIGATION      Social History   Tobacco Use   Smoking status: Never   Smokeless tobacco: Never  Substance Use Topics   Alcohol use: No    Alcohol/week: 0.0 standard drinks of alcohol   Drug use: No     Medication list has been reviewed and updated.  Current Meds  Medication Sig   acetaminophen (TYLENOL) 500 MG tablet Take 500 mg by mouth every 6 (six) hours as needed. For pain or headache    albuterol (PROVENTIL) (2.5 MG/3ML) 0.083% nebulizer solution Take 3 mLs (2.5 mg total) by nebulization every 6 (six) hours as needed for wheezing or shortness of breath.   albuterol (VENTOLIN HFA) 108 (90 Base) MCG/ACT inhaler Inhale 2 puffs into the lungs every 6 (six) hours as needed for wheezing or shortness of breath.   [DISCONTINUED] ipratropium-albuterol (DUONEB) 0.5-2.5 (3) MG/3ML SOLN Take 3 mLs by nebulization every 6 (six) hours as needed.       07/14/2023   11:28 AM 06/02/2022   11:06 AM 01/07/2022    9:35 AM 07/19/2021    3:12 PM  GAD 7 : Generalized  Anxiety Score  Nervous, Anxious, on Edge 0 0 0 0  Control/stop worrying 0 0 1 0  Worry too much - different things 0 1 1 0  Trouble relaxing 1 0 0 0  Restless 0 0 0 0  Easily annoyed or irritable 1 1 1  0  Afraid - awful might happen 0 0 0 0  Total GAD 7 Score 2 2 3  0  Anxiety Difficulty Not difficult at all Not difficult at all Not difficult at all Not difficult at all       07/14/2023   11:28 AM 06/02/2022   11:05 AM 01/07/2022    9:35 AM  Depression screen PHQ 2/9  Decreased Interest 1 0 0  Down, Depressed, Hopeless 0 0 0  PHQ - 2 Score 1 0 0  Altered sleeping 1 1 1   Tired, decreased energy 1 1 2   Change in appetite 0 0 0  Feeling bad or failure  about yourself  0 0 0  Trouble concentrating 1 0 0  Moving slowly or fidgety/restless 0 0 0  Suicidal thoughts 0 0 0  PHQ-9 Score 4 2 3   Difficult doing work/chores Not difficult at all Not difficult at all Not difficult at all    BP Readings from Last 3 Encounters:  07/14/23 128/74  06/02/22 122/62  01/07/22 110/70    Physical Exam Vitals and nursing note reviewed.  Constitutional:      General: She is not in acute distress.    Appearance: She is not diaphoretic.  HENT:     Head: Normocephalic and atraumatic.     Right Ear: External ear normal.     Left Ear: External ear normal.     Nose: Nose normal.  Eyes:     General:        Right eye: No discharge.        Left eye: No discharge.     Conjunctiva/sclera: Conjunctivae normal.  Neck:     Thyroid: No thyromegaly.     Vascular: No JVD.  Cardiovascular:     Rate and Rhythm: Normal rate and regular rhythm.     Heart sounds: Normal heart sounds. No murmur heard.    No friction rub. No gallop.  Pulmonary:     Effort: Pulmonary effort is normal. No respiratory distress.     Breath sounds: Wheezing present. No rhonchi or rales.  Chest:     Chest wall: No tenderness.  Abdominal:     General: Bowel sounds are normal.     Palpations: Abdomen is soft. There is no mass.     Tenderness: There is no abdominal tenderness. There is no guarding.  Musculoskeletal:        General: Normal range of motion.     Cervical back: Normal range of motion and neck supple.  Lymphadenopathy:     Cervical: No cervical adenopathy.  Skin:    General: Skin is warm and dry.  Neurological:     Mental Status: She is alert.     Deep Tendon Reflexes: Reflexes are normal and symmetric.     Wt Readings from Last 3 Encounters:  07/14/23 170 lb (77.1 kg)  06/02/22 162 lb 12.8 oz (73.8 kg)  01/07/22 165 lb (74.8 kg)    BP 128/74   Pulse 89   Ht 5\' 7"  (1.702 m)   Wt 170 lb (77.1 kg)   LMP 06/29/2023 (Exact Date)   SpO2 98%   BMI 26.63 kg/m    Assessment and Plan:  1. Mild intermittent asthma, unspecified whether complicated (Primary) Chronic.  Episodic.  Stable.  Patient has had increasing cough with mild shortness of breath and air trapping/wheezing over the past several days.  Patient has not been able to use her nebulization and has had limited success with her albuterol handheld.  Exam is consistent with increased expiratory to inspiratory ratio with mild wheezing patient does not demonstrate tachypnea and I do not feel like she is at risk.  We will refill albuterol nebulizing solution to be used every 6 hours as needed and I have also refilled her handheld albuterol inhaler as that this will be coming up in the next 1 to 2 months as well. - albuterol (PROVENTIL) (2.5 MG/3ML) 0.083% nebulizer solution; Take 3 mLs (2.5 mg total) by nebulization every 6 (six) hours as needed for wheezing or shortness of breath.  Dispense: 75 mL; Refill: 12 - albuterol (VENTOLIN HFA) 108 (90 Base) MCG/ACT inhaler; Inhale 2 puffs into the lungs every 6 (six) hours as needed for wheezing or shortness of breath.  Dispense: 1 each; Refill: 11  2. Bronchiolitis Patient has a bronchiolitis with cough I would suggest that she use Mucinex DM for cough for mucolytic action and mild cough suppression and initiate her nebulized albuterol on an immediate basis. - albuterol (VENTOLIN HFA) 108 (90 Base) MCG/ACT inhaler; Inhale 2 puffs into the lungs every 6 (six) hours as needed for wheezing or shortness of breath.  Dispense: 1 each; Refill: 11    Elizabeth Sauer, MD

## 2023-08-29 ENCOUNTER — Ambulatory Visit: Payer: Self-pay | Admitting: Family Medicine

## 2023-12-07 DIAGNOSIS — Z1231 Encounter for screening mammogram for malignant neoplasm of breast: Secondary | ICD-10-CM | POA: Diagnosis not present

## 2024-01-01 ENCOUNTER — Ambulatory Visit: Payer: Self-pay | Admitting: Family Medicine

## 2024-01-01 ENCOUNTER — Encounter: Payer: Self-pay | Admitting: Family Medicine

## 2024-01-01 ENCOUNTER — Ambulatory Visit (INDEPENDENT_AMBULATORY_CARE_PROVIDER_SITE_OTHER): Admitting: Family Medicine

## 2024-01-01 ENCOUNTER — Encounter (HOSPITAL_BASED_OUTPATIENT_CLINIC_OR_DEPARTMENT_OTHER): Payer: Self-pay

## 2024-01-01 VITALS — BP 112/72 | HR 68 | Ht 67.0 in | Wt 169.2 lb

## 2024-01-01 DIAGNOSIS — Z131 Encounter for screening for diabetes mellitus: Secondary | ICD-10-CM

## 2024-01-01 DIAGNOSIS — Z0000 Encounter for general adult medical examination without abnormal findings: Secondary | ICD-10-CM | POA: Diagnosis not present

## 2024-01-01 DIAGNOSIS — Z13220 Encounter for screening for lipoid disorders: Secondary | ICD-10-CM

## 2024-01-01 DIAGNOSIS — Z136 Encounter for screening for cardiovascular disorders: Secondary | ICD-10-CM | POA: Diagnosis not present

## 2024-01-01 DIAGNOSIS — Z117 Encounter for testing for latent tuberculosis infection: Secondary | ICD-10-CM

## 2024-01-01 DIAGNOSIS — Z130 Encounter for screening for diseases of the blood and blood-forming organs and certain disorders involving the immune mechanism: Secondary | ICD-10-CM | POA: Diagnosis not present

## 2024-01-01 DIAGNOSIS — Z124 Encounter for screening for malignant neoplasm of cervix: Secondary | ICD-10-CM

## 2024-01-01 DIAGNOSIS — J4520 Mild intermittent asthma, uncomplicated: Secondary | ICD-10-CM | POA: Insufficient documentation

## 2024-01-01 NOTE — Assessment & Plan Note (Signed)
 Controlled with Albuterol  inhaler, prn. Last use April.

## 2024-01-01 NOTE — Progress Notes (Signed)
 Complete physical exam  Patient: Misty Archer   DOB: 11/13/1980   43 y.o. Female  MRN: [id number]  Subjective:    Chief Complaint  Patient presents with   Annual Exam    Patient is here to get a physical for employment & tb test    Misty Archer is a 43 y.o. female who presents today for a complete physical exam. She reports consuming a general diet. The patient does not participate in regular exercise at present. She generally feels fairly well. She reports sleeping fairly well. She does not have additional problems to discuss today.  She works at day care. Requesting paper work and TB test.   No significant changes since last visit.  Asthma:  Diagnosed age 15.  On rescue inhaler.   Preventive care:  Mammogram : July 1st , normal per patient.  She does monthly breast exams. No concerns.  Pap smear:  Requesting referral to gyn.  Would prefer [place] area,   Has two daughters age 30 and 32.  Most recent fall risk assessment:    01/01/2024    8:06 AM  Fall Risk   Falls in the past year? 0  Number falls in past yr: 0  Injury with Fall? 0  Risk for fall due to : No Fall Risks  Follow up Falls evaluation completed     Most recent depression screenings:    01/01/2024    8:13 AM 07/14/2023   11:28 AM  PHQ 2/9 Scores  PHQ - 2 Score 0 1  PHQ- 9 Score 3 4    Vision:Not within last year  and Dental: No current dental problems and No regular dental care   Past Medical History:  Diagnosis Date   Asthma    Allergies  Allergen Reactions   Codeine Rash and Hives   Latex Rash and Dermatitis      Patient Care Team: Joshua Cathryne BROCKS, MD (Inactive) as PCP - General (Family Medicine)   Outpatient Medications Prior to Visit  Medication Sig   acetaminophen  (TYLENOL ) 500 MG tablet Take 500 mg by mouth every 6 (six) hours as needed. For pain or headache    albuterol  (PROVENTIL ) (2.5 MG/3ML) 0.083% nebulizer solution Take 3 mLs (2.5 mg total) by nebulization every 6  (six) hours as needed for wheezing or shortness of breath.   albuterol  (VENTOLIN  HFA) 108 (90 Base) MCG/ACT inhaler Inhale 2 puffs into the lungs every 6 (six) hours as needed for wheezing or shortness of breath.   No facility-administered medications prior to visit.    Review of Systems  All other systems reviewed and are negative.         Objective:     BP 112/72   Pulse 68   Ht 5' 7 (1.702 m)   Wt 169 lb 4 oz (76.8 kg)   LMP 12/08/2023   SpO2 100%   BMI 26.51 kg/m  BP Readings from Last 3 Encounters:  01/01/24 112/72  07/14/23 128/74  06/02/22 122/62   Wt Readings from Last 3 Encounters:  01/01/24 169 lb 4 oz (76.8 kg)  07/14/23 170 lb (77.1 kg)  06/02/22 162 lb 12.8 oz (73.8 kg)      Physical Exam Vitals and nursing note reviewed.  Constitutional:      Appearance: Normal appearance.  HENT:     Head: Normocephalic.     Right Ear: External ear normal.     Left Ear: External ear normal.  Eyes:     Conjunctiva/sclera: Conjunctivae  normal.  Cardiovascular:     Rate and Rhythm: Normal rate.  Pulmonary:     Effort: Pulmonary effort is normal.  Abdominal:     Palpations: Abdomen is soft.  Musculoskeletal:        General: Normal range of motion.  Skin:    General: Skin is warm.  Neurological:     Mental Status: She is alert and oriented to person, place, and time.  Psychiatric:        Mood and Affect: Mood normal.      No results found for any visits on 01/01/24.      Assessment & Plan:    Routine Health Maintenance and Physical Exam  Immunization History  Administered Date(s) Administered   Influenza,inj,Quad PF,6+ Mos 04/23/2018, 03/18/2019   PFIZER(Purple Top)SARS-COV-2 Vaccination 08/10/2019, 08/31/2019   PPD Test 11/24/2017   Tdap 11/24/2017    Health Maintenance  Topic Date Due   HIV Screening  Never done   Hepatitis C Screening  Never done   Pneumococcal Vaccine 43-9 Years old (1 of 2 - PCV) Never done   Hepatitis B Vaccines (1  of 3 - 19+ 3-dose series) Never done   HPV VACCINES (1 - 3-dose SCDM series) Never done   Cervical Cancer Screening (HPV/Pap Cotest)  11/25/2021   COVID-19 Vaccine (3 - 2024-25 season) 02/12/2023   INFLUENZA VACCINE  01/12/2024   DTaP/Tdap/Td (2 - Td or Tdap) 11/25/2027   Meningococcal B Vaccine  Aged Out    Discussed health benefits of physical activity, and encouraged her to engage in regular exercise appropriate for her age and condition.  Problem List Items Addressed This Visit       Respiratory   Mild intermittent asthma   Controlled with Albuterol  inhaler, prn. Last use April.      Other Visit Diagnoses       Annual physical exam    -  Primary   Relevant Orders   Comprehensive metabolic panel with GFR   TSH Rfx on Abnormal to Free T4     Encounter for lipid screening for cardiovascular disease       Relevant Orders   Lipid panel     Diabetes mellitus screening       Relevant Orders   Hemoglobin A1c     Screening for iron deficiency anemia       Relevant Orders   CBC with Differential/Platelet     Papanicolaou smear for cervical cancer screening       Relevant Orders   Ambulatory referral to Gynecology     Encounter for testing for latent tuberculosis       Relevant Orders   QuantiFERON-TB Gold Plus      Return in about 1 year (around 12/31/2024) for annual.     Vinary K Christinia Lambeth, MD

## 2024-01-04 ENCOUNTER — Ambulatory Visit: Payer: Self-pay | Admitting: Family Medicine

## 2024-01-04 LAB — QUANTIFERON-TB GOLD PLUS
QuantiFERON Mitogen Value: >10 [IU]/mL
QuantiFERON Nil Value: 0.03 [IU]/mL
QuantiFERON TB1 Ag Value: 0.06 [IU]/mL
QuantiFERON TB2 Ag Value: 0.07 [IU]/mL
QuantiFERON-TB Gold Plus: NEGATIVE

## 2024-01-04 LAB — CBC WITH DIFFERENTIAL/PLATELET
Basophils Absolute: 0 x10E3/uL (ref 0.0–0.2)
Basos: 1 %
EOS (ABSOLUTE): 0.1 x10E3/uL (ref 0.0–0.4)
Eos: 2 %
Hematocrit: 36.8 % (ref 34.0–46.6)
Hemoglobin: 11.4 g/dL (ref 11.1–15.9)
Immature Grans (Abs): 0 x10E3/uL (ref 0.0–0.1)
Immature Granulocytes: 0 %
Lymphocytes Absolute: 2.3 x10E3/uL (ref 0.7–3.1)
Lymphs: 36 %
MCH: 28.5 pg (ref 26.6–33.0)
MCHC: 31 g/dL — ABNORMAL LOW (ref 31.5–35.7)
MCV: 92 fL (ref 79–97)
Monocytes Absolute: 0.4 x10E3/uL (ref 0.1–0.9)
Monocytes: 6 %
Neutrophils Absolute: 3.5 x10E3/uL (ref 1.4–7.0)
Neutrophils: 55 %
Platelets: 221 x10E3/uL (ref 150–450)
RBC: 4 x10E6/uL (ref 3.77–5.28)
RDW: 12.6 % (ref 11.7–15.4)
WBC: 6.4 x10E3/uL (ref 3.4–10.8)

## 2024-01-04 LAB — HEMOGLOBIN A1C
Est. average glucose Bld gHb Est-mCnc: 105 mg/dL
Hgb A1c MFr Bld: 5.3 % (ref 4.8–5.6)

## 2024-01-04 LAB — LIPID PANEL
Chol/HDL Ratio: 3.3 ratio (ref 0.0–4.4)
Cholesterol, Total: 154 mg/dL (ref 100–199)
HDL: 47 mg/dL (ref >39)
LDL Chol Calc (NIH): 93 mg/dL (ref 0–99)
Triglycerides: 73 mg/dL (ref 0–149)
VLDL Cholesterol Cal: 14 mg/dL (ref 5–40)

## 2024-01-04 LAB — TSH RFX ON ABNORMAL TO FREE T4: TSH: 2.93 u[IU]/mL (ref 0.450–4.500)

## 2024-01-04 LAB — COMPREHENSIVE METABOLIC PANEL WITH GFR
ALT: 12 IU/L (ref 0–32)
AST: 12 IU/L (ref 0–40)
Albumin: 3.9 g/dL (ref 3.9–4.9)
Alkaline Phosphatase: 50 IU/L (ref 44–121)
BUN/Creatinine Ratio: 13 (ref 9–23)
BUN: 12 mg/dL (ref 6–24)
Bilirubin Total: 0.8 mg/dL (ref 0.0–1.2)
CO2: 17 mmol/L — ABNORMAL LOW (ref 20–29)
Calcium: 9 mg/dL (ref 8.7–10.2)
Chloride: 106 mmol/L (ref 96–106)
Creatinine, Ser: 0.92 mg/dL (ref 0.57–1.00)
Globulin, Total: 2.8 g/dL (ref 1.5–4.5)
Glucose: 87 mg/dL (ref 70–99)
Potassium: 4.4 mmol/L (ref 3.5–5.2)
Sodium: 137 mmol/L (ref 134–144)
Total Protein: 6.7 g/dL (ref 6.0–8.5)
eGFR: 79 mL/min/1.73 (ref >59)

## 2024-01-10 ENCOUNTER — Encounter (HOSPITAL_BASED_OUTPATIENT_CLINIC_OR_DEPARTMENT_OTHER): Payer: Self-pay
